# Patient Record
Sex: Female | Born: 1990
Health system: Southern US, Community
[De-identification: ages and names within clinical notes are randomized; demographics above are authoritative.]

## PROBLEM LIST (undated history)

## (undated) DIAGNOSIS — D649 Anemia, unspecified: Secondary | ICD-10-CM

## (undated) DIAGNOSIS — T7840XA Allergy, unspecified, initial encounter: Secondary | ICD-10-CM

## (undated) HISTORY — DX: Allergy, unspecified, initial encounter: T78.40XA

## (undated) HISTORY — DX: Anemia, unspecified: D64.9

## (undated) HISTORY — PX: NO PAST SURGERIES: SHX2092

---
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
## 2009-07-21 ENCOUNTER — Ambulatory Visit: Payer: Self-pay | Admitting: Family Medicine

## 2009-08-30 ENCOUNTER — Ambulatory Visit: Payer: Self-pay | Admitting: Family Medicine

## 2009-08-30 DIAGNOSIS — R300 Dysuria: Secondary | ICD-10-CM | POA: Insufficient documentation

## 2009-08-30 LAB — CONVERTED CEMR LAB
Glucose, Urine, Semiquant: NEGATIVE
Nitrite: NEGATIVE
Specific Gravity, Urine: >=1.03
pH: 5

## 2010-04-05 NOTE — Assessment & Plan Note (Signed)
Summary: talk about birth control.   Vital Signs:  Patient profile:   20 year old female Height:      65 inches Weight:      143 pounds BMI:     23.88 Temp:     98.1 degrees F oral Pulse rate:   72 / minute Pulse rhythm:   regular BP sitting:   100 / 60  (left arm) Cuff size:   regular  Vitals Entered By: Linde Gillis CMA Duncan Dull) (Jul 21, 2009 12:08 PM) CC: talk about birth control   History of Present Illness: 20 yo female new to me here to discuss birth control.  Sexually active since last year with one partner. They use condoms but she is interested in OCPs to also help regulate her periods. Sometimes skips several weeks between periods. Never heavy, cramping not severe.  Never had a pap smear. No h/o STDs. Never been on birth control in past.  No h/o elevated TG.  Current Medications (verified): 1)  Ortho Tri-Cyclen (28) 0.18/0.215/0.25 Mg-35 Mcg Tabs (Norgestim-Eth Estrad Triphasic) .... Use As Directed.  Allergies (verified): No Known Drug Allergies  Review of Systems      See HPI  Physical Exam  General:  Well-developed,well-nourished,in no acute distress; alert,appropriate and cooperative throughout examination Psych:  Cognition and judgment appear intact. Alert and cooperative with normal attention span and concentration. No apparent delusions, illusions, hallucinations   Impression & Recommendations:  Problem # 1:  CONTRACEPTIVE MANAGEMENT (ICD-V25.09) Assessment New Time spent with patient 25 minutes, more than 50% of this time was spent counseling patient on contraception.  Will start ortho tricylin.  I did advise making appt with primary for complete physicial for pap smear and lipid panel within the year.  Complete Medication List: 1)  Ortho Tri-cyclen (28) 0.18/0.215/0.25 Mg-35 Mcg Tabs (Norgestim-eth estrad triphasic) .... Use as directed. Prescriptions: ORTHO TRI-CYCLEN (28) 0.18/0.215/0.25 MG-35 MCG TABS (NORGESTIM-ETH ESTRAD TRIPHASIC) Use as  directed.  #1 x 11   Entered and Authorized by:   Ruthe Mannan MD   Signed by:   Ruthe Mannan MD on 07/21/2009   Method used:   [facility]        [facility] (retail)       [address]       [place], [place]  [address]       Ph: [phone] or [phone]       Fax: [phone]   RxID:   [id number]   Prior Medications (reviewed today): None Current Allergies (reviewed today): No known allergies

## 2010-04-05 NOTE — Assessment & Plan Note (Signed)
Summary: UTI/DLO  Nurse Visit   Allergies: No Known Drug Allergies Laboratory Results   Urine Tests  Date/Time Received: August 30, 2009 3:55 PM  Date/Time Reported: August 30, 2009 3:55 PM   Routine Urinalysis   Color: yellow Appearance: Clear Glucose: negative   (Normal Range: Negative) Bilirubin: negative   (Normal Range: Negative) Ketone: negative   (Normal Range: Negative) Spec. Gravity: >=1.030   (Normal Range: 1.003-1.035) Blood: negative   (Normal Range: Negative) pH: 5.0   (Normal Range: 5.0-8.0) Protein: trace   (Normal Range: Negative) Urobilinogen: 0.2   (Normal Range: 0-1) Nitrite: negative   (Normal Range: Negative) Leukocyte Esterace: negative   (Normal Range: Negative)    Comments: Pt complains of urinary frequency, pressure after voiding.  [facility].    Appended Document: UTI/DLO please let pt know it is normal. Will send for culture.  In the mean time, try Azo for symptoms.  Appended Document: UTI/DLO    Clinical Lists Changes  Problems: Added new problem of DYSURIA (ICD-788.1) Orders: Added new Service order of Specimen Handling ([id number]) - Signed Added new Test order of T-Urine Culture (Spectrum Order) [id number]) - Signed

## 2011-08-07 ENCOUNTER — Other Ambulatory Visit (HOSPITAL_COMMUNITY)
Admission: RE | Admit: 2011-08-07 | Discharge: 2011-08-07 | Disposition: A | Discharge status: Home / Self Care | Payer: BC Managed Care – PPO | Source: Ambulatory Visit | Attending: Family Medicine | Admitting: Family Medicine

## 2011-08-07 ENCOUNTER — Ambulatory Visit (INDEPENDENT_AMBULATORY_CARE_PROVIDER_SITE_OTHER): Payer: BC Managed Care – PPO | Admitting: Family Medicine

## 2011-08-07 ENCOUNTER — Encounter: Payer: Self-pay | Admitting: Family Medicine

## 2011-08-07 VITALS — BP 108/70 | HR 68 | Temp 98.1°F | Wt 173.0 lb

## 2011-08-07 DIAGNOSIS — Z01419 Encounter for gynecological examination (general) (routine) without abnormal findings: Secondary | ICD-10-CM | POA: Insufficient documentation

## 2011-08-07 DIAGNOSIS — Z0000 Encounter for general adult medical examination without abnormal findings: Secondary | ICD-10-CM | POA: Insufficient documentation

## 2011-08-07 DIAGNOSIS — Z136 Encounter for screening for cardiovascular disorders: Secondary | ICD-10-CM

## 2011-08-07 DIAGNOSIS — Z113 Encounter for screening for infections with a predominantly sexual mode of transmission: Secondary | ICD-10-CM | POA: Insufficient documentation

## 2011-08-07 LAB — COMPREHENSIVE METABOLIC PANEL
Albumin: 3.8 g/dL (ref 3.5–5.2)
Alkaline Phosphatase: 49 U/L (ref 39–117)
BUN: 14 mg/dL (ref 6–23)
CO2: 27 mEq/L (ref 19–32)
GFR: 144.42 mL/min (ref >60.00)
Glucose, Bld: 85 mg/dL (ref 70–99)
Sodium: 140 mEq/L (ref 135–145)
Total Bilirubin: 0.6 mg/dL (ref 0.3–1.2)
Total Protein: 6.9 g/dL (ref 6.0–8.3)

## 2011-08-07 LAB — LIPID PANEL
Cholesterol: 176 mg/dL (ref 0–200)
Triglycerides: 49 mg/dL (ref 0.0–149.0)

## 2011-08-07 MED ORDER — NORGESTIM-ETH ESTRAD TRIPHASIC 0.18/0.215/0.25 MG-35 MCG PO TABS: 1.0000 | ORAL_TABLET | Freq: Every day | ORAL | Status: DC

## 2011-08-07 NOTE — Patient Instructions (Signed)
Great to see you. Please say hello to your family for me. Have a great summer. We will call you with your results from today.

## 2011-08-07 NOTE — Progress Notes (Signed)
Subjective:    Patient ID: Regina Kidd, female    DOB: 11-05-1990, 21 y.o.   MRN: [id number]  HPI  21 yo G0 here for CPX/Gyn exam.  Currently sexually active with one partner, uses condoms. She would like to go back on OCPs, periods are very irregular.  Denies any vaginal discharge, dysuria or other symptoms.  Has never had a pap smear.  No family h/o cervical CA or ovarian CA.  Patient Active Problem List  Diagnoses  . DYSURIA  . Routine general medical examination at a health care facility  . Routine gynecological examination   No past medical history on file. No past surgical history on file. History  Substance Use Topics  . Smoking status: Never Smoker   . Smokeless tobacco: Not on file  . Alcohol Use: Not on file   No family history on file. No Known Allergies Current Outpatient Prescriptions on File Prior to Visit  Medication Sig Dispense Refill  . Norgestimate-Ethinyl Estradiol Triphasic 0.18/0.215/0.25 MG-35 MCG tablet Take 1 tablet by mouth daily.  1 Package  11   The PMH, PSH, Social History, Family History, Medications, and allergies have been reviewed in [facility], and have been updated if relevant.    Review of Systems See HPI Patient reports no  vision/ hearing changes,anorexia, weight change, fever ,adenopathy, persistant / recurrent hoarseness, swallowing issues, chest pain, edema,persistant / recurrent cough, hemoptysis, dyspnea(rest, exertional, paroxysmal nocturnal), gastrointestinal  bleeding (melena, rectal bleeding), abdominal pain, excessive heart burn, GU symptoms(dysuria, hematuria, pyuria, voiding/incontinence  Issues) syncope, focal weakness, severe memory loss, concerning skin lesions, depression, anxiety, abnormal bruising/bleeding, major joint swelling, breast masses or abnormal vaginal bleeding.       Objective:   Physical Exam BP 108/70  Pulse 68  Temp(Src) 98.1 F (36.7 C) (Oral)  Wt 173 lb (78.472 kg)  LMP 07/17/2011  General:   Well-developed,well-nourished,in no acute distress; alert,appropriate and cooperative throughout examination Head:  normocephalic and atraumatic.   Eyes:  vision grossly intact, pupils equal, pupils round, and pupils reactive to light.   Ears:  R ear normal and L ear normal.   Nose:  no external deformity.   Mouth:  good dentition.   Neck:  No deformities, masses, or tenderness noted. Breasts:  No mass, nodules, thickening, tenderness, bulging, retraction, inflamation, nipple discharge or skin changes noted.   Lungs:  Normal respiratory effort, chest expands symmetrically. Lungs are clear to auscultation, no crackles or wheezes. Heart:  Normal rate and regular rhythm. S1 and S2 normal without gallop, murmur, click, rub or other extra sounds. Abdomen:  Bowel sounds positive,abdomen soft and non-tender without masses, organomegaly or hernias noted. Rectal:  no external abnormalities.   Genitalia:  Pelvic Exam:        External: normal female genitalia without lesions or masses        Vagina: normal without lesions or masses        Cervix: normal without lesions or masses        Adnexa: normal bimanual exam without masses or fullness        Uterus: normal by palpation        Pap smear: performed Msk:  No deformity or scoliosis noted of thoracic or lumbar spine.   Extremities:  No clubbing, cyanosis, edema, or deformity noted with normal full range of motion of all joints.   Neurologic:  alert & oriented X3 and gait normal.   Skin:  Intact without suspicious lesions or rashes Cervical Nodes:  No lymphadenopathy noted  Axillary Nodes:  No palpable lymphadenopathy Psych:  Cognition and judgment appear intact. Alert and cooperative with normal attention span and concentration. No apparent delusions, illusions, hallucinations     Assessment & Plan:   1. Routine general medical examination at a health care facility  Reviewed preventive care protocols, scheduled due services, and updated  immunizations Discussed nutrition, exercise, diet, and healthy lifestyle.  Comprehensive metabolic panel, Cytology - PAP  2. Routine gynecological examination  Rx for orthotricyclin (which she was taking previously) sent to pharmacy. Discussed importance of continuing to use condoms for STD protection. Refused testing today for HIV and RPR.  She will call back to set up lab appt. Cytology - PAP GC/Chlamydia  3. Screening for ischemic heart disease  Lipid Panel

## 2011-08-14 ENCOUNTER — Ambulatory Visit: Payer: BC Managed Care – PPO | Admitting: Family Medicine

## 2011-08-17 ENCOUNTER — Other Ambulatory Visit (HOSPITAL_COMMUNITY)
Admission: RE | Admit: 2011-08-17 | Discharge: 2011-08-17 | Disposition: A | Discharge status: Home / Self Care | Payer: BC Managed Care – PPO | Source: Ambulatory Visit | Attending: Family Medicine | Admitting: Family Medicine

## 2011-08-17 ENCOUNTER — Ambulatory Visit (INDEPENDENT_AMBULATORY_CARE_PROVIDER_SITE_OTHER): Payer: BC Managed Care – PPO | Admitting: Family Medicine

## 2011-08-17 DIAGNOSIS — Z01419 Encounter for gynecological examination (general) (routine) without abnormal findings: Secondary | ICD-10-CM | POA: Insufficient documentation

## 2011-08-17 DIAGNOSIS — Z124 Encounter for screening for malignant neoplasm of cervix: Secondary | ICD-10-CM

## 2011-08-17 DIAGNOSIS — R87810 Cervical high risk human papillomavirus (HPV) DNA test positive: Secondary | ICD-10-CM | POA: Insufficient documentation

## 2011-08-17 DIAGNOSIS — R87615 Unsatisfactory cytologic smear of cervix: Secondary | ICD-10-CM

## 2011-08-17 NOTE — Addendum Note (Signed)
Addended by: Eliezer Bottom on: 08/17/2011 09:45 AM   Modules accepted: Orders

## 2011-08-17 NOTE — Progress Notes (Signed)
Very pleasant 52 you female here for repeat pap- free of charge.  Pap last week- specimen was too scant to be read.   She is having no complaints.   General:  Well-developed,well-nourished,in no acute distress; alert,appropriate and cooperative throughout examination Rectal:  no external abnormalities.   Genitalia:  Pelvic Exam:        External: normal female genitalia without lesions or masses        Vagina: normal without lesions or masses        Cervix: normal without lesions or masses        Adnexa: normal bimanual exam without masses or fullness        Uterus: normal by palpation        Pap smear: performed  Assessment and Plan: 1. Encounter for repeat Pap smear due to previous insufficient cervical cells    Pap repeated today. Tolerated well.

## 2012-06-20 ENCOUNTER — Other Ambulatory Visit: Payer: Self-pay

## 2012-06-20 NOTE — Telephone Encounter (Signed)
Pt request refill on BC pill; pt had rx transferred to [facility] and they said she does not have refills. Pt coming home and wants refill to [facility]. Pt has appt to see Dr Dayton Martes in 07/2012. [place] said pt should still have 3 refills. Pt will pick up rx.

## 2012-07-11 ENCOUNTER — Ambulatory Visit: Payer: BC Managed Care – PPO | Admitting: Family Medicine

## 2012-07-15 ENCOUNTER — Ambulatory Visit (INDEPENDENT_AMBULATORY_CARE_PROVIDER_SITE_OTHER): Payer: BC Managed Care – PPO | Admitting: Family Medicine

## 2012-07-15 ENCOUNTER — Other Ambulatory Visit (HOSPITAL_COMMUNITY)
Admission: RE | Admit: 2012-07-15 | Discharge: 2012-07-15 | Disposition: A | Discharge status: Home / Self Care | Payer: BC Managed Care – PPO | Source: Ambulatory Visit | Attending: Family Medicine | Admitting: Family Medicine

## 2012-07-15 ENCOUNTER — Encounter: Payer: Self-pay | Admitting: Family Medicine

## 2012-07-15 VITALS — BP 100/60 | HR 72 | Temp 98.2°F | Wt 176.0 lb

## 2012-07-15 DIAGNOSIS — Z3009 Encounter for other general counseling and advice on contraception: Secondary | ICD-10-CM | POA: Insufficient documentation

## 2012-07-15 DIAGNOSIS — Z113 Encounter for screening for infections with a predominantly sexual mode of transmission: Secondary | ICD-10-CM

## 2012-07-15 DIAGNOSIS — Z01419 Encounter for gynecological examination (general) (routine) without abnormal findings: Secondary | ICD-10-CM

## 2012-07-15 MED ORDER — NORGESTIM-ETH ESTRAD TRIPHASIC 0.18/0.215/0.25 MG-35 MCG PO TABS: 1.0000 | ORAL_TABLET | Freq: Every day | ORAL | Status: DC

## 2012-07-15 NOTE — Progress Notes (Signed)
Subjective:    Patient ID: Regina Kidd, female    DOB: 1990-05-27, 22 y.o.   MRN: [id number]  HPI  22 yo G0 here for refill of OCPs/pap smear.  Currently sexually active with one partner, uses condoms. Periods are now regular on OCPs.   No spotting during the month.  Denies any vaginal discharge, dysuria or other symptoms.  Last pap smear(1st one) done here in 08/2011.  LSIL with positive HPV- needed repeat cytology(pap) in 1 year but [place] in [place]- would like pap done today.   No family h/o cervical CA or ovarian CA.  Patient Active Problem List   Diagnosis Date Noted  . Other general counseling and advice for contraceptive management 07/15/2012   No past medical history on file. No past surgical history on file. History  Substance Use Topics  . Smoking status: Never Smoker   . Smokeless tobacco: Not on file  . Alcohol Use: Not on file   No family history on file. No Known Allergies Current Outpatient Prescriptions on File Prior to Visit  Medication Sig Dispense Refill  . Norgestimate-Ethinyl Estradiol Triphasic 0.18/0.215/0.25 MG-35 MCG tablet Take 1 tablet by mouth daily.  1 Package  11   No current facility-administered medications on file prior to visit.   The PMH, PSH, Social History, Family History, Medications, and allergies have been reviewed in [facility], and have been updated if relevant.    Review of Systems See HPI     Objective:   Physical Exam BP 100/60  Pulse 72  Temp(Src) 98.2 F (36.8 C)  Wt 176 lb (79.833 kg)  BMI 29.29 kg/m2   General:  Well-developed,well-nourished,in no acute distress; alert,appropriate and cooperative throughout examination Head:  normocephalic and atraumatic.   Eyes:  vision grossly intact, pupils equal, pupils round, and pupils reactive to light.   Ears:  R ear normal and L ear normal.   Nose:  no external deformity.   Mouth:  good dentition.   Neck:  No deformities, masses, or tenderness  noted. Breasts:  No mass, nodules, thickening, tenderness, bulging, retraction, inflamation, nipple discharge or skin changes noted.   Lungs:  Normal respiratory effort, chest expands symmetrically. Lungs are clear to auscultation, no crackles or wheezes. Heart:  Normal rate and regular rhythm. S1 and S2 normal without gallop, murmur, click, rub or other extra sounds. Abdomen:  Bowel sounds positive,abdomen soft and non-tender without masses, organomegaly or hernias noted. Rectal:  no external abnormalities.   Genitalia:  Pelvic Exam:        External: normal female genitalia without lesions or masses        Vagina: normal without lesions or masses        Cervix: normal without lesions or masses        Adnexa: normal bimanual exam without masses or fullness        Uterus: normal by palpation        Pap smear: performed Msk:  No deformity or scoliosis noted of thoracic or lumbar spine.   Extremities:  No clubbing, cyanosis, edema, or deformity noted with normal full range of motion of all joints.   Neurologic:  alert & oriented X3 and gait normal.   Skin:  Intact without suspicious lesions or rashes Cervical Nodes:  No lymphadenopathy noted Axillary Nodes:  No palpable lymphadenopathy Psych:  Cognition and judgment appear intact. Alert and cooperative with normal attention span and concentration. No apparent delusions, illusions, hallucinations  Assessment & Plan:    1. Other general counseling and advice for contraceptive management Doing well on current OCPs.  Rx refilled.  2. Encounter for routine gynecological examination Discussed dangers of smoking, alcohol, and drug abuse.  Also discussed sexual activity, pregnancy risk, and STD risk.  Encouraged to get regular exercise. Pap smear, GC/Chlamydia.

## 2012-11-21 ENCOUNTER — Ambulatory Visit (INDEPENDENT_AMBULATORY_CARE_PROVIDER_SITE_OTHER): Payer: BC Managed Care – PPO | Admitting: Family Medicine

## 2012-11-21 ENCOUNTER — Ambulatory Visit: Payer: Self-pay | Admitting: Family Medicine

## 2012-11-21 ENCOUNTER — Encounter: Payer: Self-pay | Admitting: Family Medicine

## 2012-11-21 VITALS — BP 102/70 | HR 77 | Temp 98.3°F | Ht 65.0 in | Wt 187.0 lb

## 2012-11-21 DIAGNOSIS — J069 Acute upper respiratory infection, unspecified: Secondary | ICD-10-CM

## 2012-11-21 MED ORDER — HYDROCODONE-HOMATROPINE 5-1.5 MG/5ML PO SYRP: 5.0000 mL | ORAL_SOLUTION | Freq: Three times a day (TID) | ORAL | Status: DC | PRN

## 2012-11-21 MED ORDER — AMOXICILLIN 875 MG PO TABS: 875.0000 mg | ORAL_TABLET | Freq: Two times a day (BID) | ORAL | Status: DC

## 2012-11-21 NOTE — Patient Instructions (Addendum)
Take amoxicillin as directed.  Drink lots of fluids.   Treat sympotmatically with Mucinex, nasal saline irrigation, and Tylenol/Ibuprofen.   You can use warm compresses.  Cough suppressant at night.   Call if not improving as expected in 5-7 days.

## 2012-11-21 NOTE — Progress Notes (Signed)
SUBJECTIVE:  Regina Kidd is a 22 y.o. female who complains of coryza, congestion, sneezing, sore throat, dry cough and bilateral sinus pain for 12 days. She denies a history of anorexia, chest pain, fevers, myalgias, shortness of breath, sweats and vomiting and denies a history of asthma. Patient denies smoke cigarettes.   There are no active problems to display for this patient.  No past medical history on file. No past surgical history on file. History  Substance Use Topics  . Smoking status: Never Smoker   . Smokeless tobacco: Not on file  . Alcohol Use: Not on file   No family history on file. No Known Allergies Current Outpatient Prescriptions on File Prior to Visit  Medication Sig Dispense Refill  . Norgestimate-Ethinyl Estradiol Triphasic 0.18/0.215/0.25 MG-35 MCG tablet Take 1 tablet by mouth daily.  1 Package  11   No current facility-administered medications on file prior to visit.   The PMH, PSH, Social History, Family History, Medications, and allergies have been reviewed in [facility], and have been updated if relevant.  OBJECTIVE: BP 102/70  Pulse 77  Temp(Src) 98.3 F (36.8 C)  Ht 5\' 5"  (1.651 m)  Wt 187 lb (84.823 kg)  BMI 31.12 kg/m2  She appears well, vital signs are as noted. Ears normal.  Throat and pharynx normal.  Neck supple. No adenopathy in the neck. Nose is congested. Sinuses tender. The chest is clear, without wheezes or rales.  ASSESSMENT:  sinusitis  PLAN: Given duration and progression of symptoms, will treat for bacterial sinusitis with amoxicillin. Tussionex as needed at bed time for cough. Discussed using condoms while and short period after taking abx as abx can decrease effectiveness of OCPs. Symptomatic therapy suggested: push fluids, rest and return office visit prn if symptoms persist or worsen. Call or return to clinic prn if these symptoms worsen or fail to improve as anticipated. The patient indicates understanding of these issues and  agrees with the plan.

## 2013-06-22 ENCOUNTER — Other Ambulatory Visit: Payer: Self-pay | Admitting: Family Medicine

## 2013-07-22 ENCOUNTER — Other Ambulatory Visit: Payer: Self-pay | Admitting: Family Medicine

## 2013-07-25 ENCOUNTER — Other Ambulatory Visit: Payer: Self-pay | Admitting: Family Medicine

## 2013-07-25 NOTE — Telephone Encounter (Signed)
Attempted to contact pt; unable to leave message. Rx sent to requested pharmacy. Pt MUST be seen with CPE to receive additional refills. LAst CPE 10/2011

## 2013-10-09 ENCOUNTER — Other Ambulatory Visit: Payer: Self-pay | Admitting: Family Medicine

## 2013-10-16 ENCOUNTER — Encounter: Payer: BC Managed Care – PPO | Admitting: Family Medicine

## 2013-10-23 ENCOUNTER — Other Ambulatory Visit (HOSPITAL_COMMUNITY)
Admission: RE | Admit: 2013-10-23 | Discharge: 2013-10-23 | Disposition: A | Discharge status: Home / Self Care | Payer: BC Managed Care – PPO | Source: Ambulatory Visit | Attending: Family Medicine | Admitting: Family Medicine

## 2013-10-23 ENCOUNTER — Ambulatory Visit (INDEPENDENT_AMBULATORY_CARE_PROVIDER_SITE_OTHER): Payer: BC Managed Care – PPO | Admitting: Family Medicine

## 2013-10-23 ENCOUNTER — Encounter: Payer: Self-pay | Admitting: Family Medicine

## 2013-10-23 ENCOUNTER — Encounter (INDEPENDENT_AMBULATORY_CARE_PROVIDER_SITE_OTHER): Payer: Self-pay

## 2013-10-23 VITALS — BP 102/72 | HR 69 | Temp 98.5°F | Ht 64.5 in | Wt 188.0 lb

## 2013-10-23 DIAGNOSIS — N760 Acute vaginitis: Secondary | ICD-10-CM | POA: Diagnosis present

## 2013-10-23 DIAGNOSIS — Z01419 Encounter for gynecological examination (general) (routine) without abnormal findings: Secondary | ICD-10-CM | POA: Insufficient documentation

## 2013-10-23 DIAGNOSIS — Z0000 Encounter for general adult medical examination without abnormal findings: Secondary | ICD-10-CM | POA: Insufficient documentation

## 2013-10-23 DIAGNOSIS — Z113 Encounter for screening for infections with a predominantly sexual mode of transmission: Secondary | ICD-10-CM | POA: Insufficient documentation

## 2013-10-23 DIAGNOSIS — Z136 Encounter for screening for cardiovascular disorders: Secondary | ICD-10-CM

## 2013-10-23 DIAGNOSIS — Z1151 Encounter for screening for human papillomavirus (HPV): Secondary | ICD-10-CM | POA: Diagnosis present

## 2013-10-23 LAB — COMPREHENSIVE METABOLIC PANEL
ALT: 14 U/L (ref 0–35)
AST: 21 U/L (ref 0–37)
Albumin: 3.6 g/dL (ref 3.5–5.2)
Alkaline Phosphatase: 60 U/L (ref 39–117)
BUN: 9 mg/dL (ref 6–23)
CALCIUM: 9.1 mg/dL (ref 8.4–10.5)
CHLORIDE: 105 meq/L (ref 96–112)
CO2: 29 meq/L (ref 19–32)
Creatinine, Ser: 0.8 mg/dL (ref 0.4–1.2)
GFR: 91.18 mL/min (ref >60.00)
Glucose, Bld: 76 mg/dL (ref 70–99)
Potassium: 4.2 mEq/L (ref 3.5–5.1)
Sodium: 138 mEq/L (ref 135–145)
Total Bilirubin: 0.6 mg/dL (ref 0.2–1.2)
Total Protein: 6.6 g/dL (ref 6.0–8.3)

## 2013-10-23 LAB — CBC WITH DIFFERENTIAL/PLATELET
Basophils Absolute: 0 10*3/uL (ref 0.0–0.1)
Basophils Relative: 0.5 % (ref 0.0–3.0)
Eosinophils Absolute: 0 10*3/uL (ref 0.0–0.7)
Eosinophils Relative: 0.4 % (ref 0.0–5.0)
HCT: 40.1 % (ref 36.0–46.0)
HEMOGLOBIN: 13.1 g/dL (ref 12.0–15.0)
LYMPHS PCT: 24.3 % (ref 12.0–46.0)
Lymphs Abs: 2 10*3/uL (ref 0.7–4.0)
MCHC: 32.6 g/dL (ref 30.0–36.0)
MCV: 87.9 fl (ref 78.0–100.0)
MONOS PCT: 6.4 % (ref 3.0–12.0)
Monocytes Absolute: 0.5 10*3/uL (ref 0.1–1.0)
NEUTROS ABS: 5.5 10*3/uL (ref 1.4–7.7)
Neutrophils Relative %: 68.4 % (ref 43.0–77.0)
Platelets: 207 10*3/uL (ref 150.0–400.0)
RBC: 4.56 Mil/uL (ref 3.87–5.11)
RDW: 12.5 % (ref 11.5–15.5)
WBC: 8.1 10*3/uL (ref 4.0–10.5)

## 2013-10-23 LAB — LIPID PANEL
Cholesterol: 186 mg/dL (ref 0–200)
HDL: 53.9 mg/dL (ref >39.00)
LDL Cholesterol: 114 mg/dL — ABNORMAL HIGH (ref 0–99)
NonHDL: 132.1
TRIGLYCERIDES: 91 mg/dL (ref 0.0–149.0)
Total CHOL/HDL Ratio: 3
VLDL: 18.2 mg/dL (ref 0.0–40.0)

## 2013-10-23 LAB — TSH: TSH: 3.02 u[IU]/mL (ref 0.35–4.50)

## 2013-10-23 NOTE — Progress Notes (Signed)
Pre visit review using our clinic review tool, if applicable. No additional management support is needed unless otherwise documented below in the visit note. 

## 2013-10-23 NOTE — Addendum Note (Signed)
Addended by: Desmond DikeKNIGHT, Roshawn Lacina H on: 10/23/2013 12:20 PM   Modules accepted: Orders

## 2013-10-23 NOTE — Assessment & Plan Note (Signed)
Reviewed preventive care protocols, scheduled due services, and updated immunizations Discussed nutrition, exercise, diet, and healthy lifestyle.  Orders Placed This Encounter  Procedures  . CBC with Differential  . Comprehensive metabolic panel  . Lipid panel  . TSH  . HIV antibody (with reflex)  . RPR    

## 2013-10-23 NOTE — Patient Instructions (Signed)
Good to see you. We will call you with your lab results.   

## 2013-10-23 NOTE — Progress Notes (Signed)
Subjective:    Patient ID: Regina Kidd, female    DOB: 1990/06/03, 23 y.o.   MRN: [id number]  HPI  23 yo G0 here for CPX. Doing well- just got engaged! Currently sexually active with one partner, uses condoms and OCPs. Periods are now regular on OCPs.   No spotting during the month.  Denies any vaginal discharge, dysuria or other symptoms.  Last pap smear was done by me on 07/15/2012- neg.  Did have pap that was positive for LSIL with positive HPV on 08/17/2011.  No family h/o cervical CA or ovarian CA.  Lab Results  Component Value Date   CHOL 176 08/07/2011   HDL 56.60 08/07/2011   LDLCALC 110* 08/07/2011   TRIG 49.0 08/07/2011   CHOLHDL 3 08/07/2011   Lab Results  Component Value Date   CREATININE 0.6 08/07/2011   No results found for this basename: WBC, HGB, HCT, MCV, PLT    Patient Active Problem List   Diagnosis Date Noted  . Routine general medical examination at a health care facility 10/23/2013   No past medical history on file. No past surgical history on file. History  Substance Use Topics  . Smoking status: Never Smoker   . Smokeless tobacco: Not on file  . Alcohol Use: Not on file   No family history on file. No Known Allergies Current Outpatient Prescriptions on File Prior to Visit  Medication Sig Dispense Refill  . TRI-PREVIFEM 0.18/0.215/0.25 MG-35 MCG tablet TAKE 1 TABLET BY MOUTH DAILY.  28 tablet  2   No current facility-administered medications on file prior to visit.   The PMH, PSH, Social History, Family History, Medications, and allergies have been reviewed in [facility], and have been updated if relevant.    Review of Systems See HPI Patient reports no  vision/ hearing changes,anorexia, weight change, fever ,adenopathy, persistant / recurrent hoarseness, swallowing issues, chest pain, edema,persistant / recurrent cough, hemoptysis, dyspnea(rest, exertional, paroxysmal nocturnal), gastrointestinal  bleeding (melena, rectal bleeding), abdominal  pain, excessive heart burn, GU symptoms(dysuria, hematuria, pyuria, voiding/incontinence  Issues) syncope, focal weakness, severe memory loss, concerning skin lesions, depression, anxiety, abnormal bruising/bleeding, major joint swelling, breast masses or abnormal vaginal bleeding.       Objective:   Physical Exam BP 102/72  Pulse 69  Temp(Src) 98.5 F (36.9 C) (Oral)  Ht 5' 4.5" (1.638 m)  Wt 188 lb (85.276 kg)  BMI 31.78 kg/m2  SpO2 99%  LMP 10/14/2013   General:  Well-developed,well-nourished,in no acute distress; alert,appropriate and cooperative throughout examination Head:  normocephalic and atraumatic.   Eyes:  vision grossly intact, pupils equal, pupils round, and pupils reactive to light.   Ears:  R ear normal and L ear normal.   Nose:  no external deformity.   Mouth:  good dentition.   Neck:  No deformities, masses, or tenderness noted. Breasts:  No mass, nodules, thickening, tenderness, bulging, retraction, inflamation, nipple discharge or skin changes noted.   Lungs:  Normal respiratory effort, chest expands symmetrically. Lungs are clear to auscultation, no crackles or wheezes. Heart:  Normal rate and regular rhythm. S1 and S2 normal without gallop, murmur, click, rub or other extra sounds. Abdomen:  Bowel sounds positive,abdomen soft and non-tender without masses, organomegaly or hernias noted. Rectal:  no external abnormalities.   Genitalia:  Pelvic Exam:        External: normal female genitalia without lesions or masses        Vagina: normal without lesions or masses  Cervix: normal without lesions or masses        Adnexa: normal bimanual exam without masses or fullness        Uterus: normal by palpation        Pap smear: performed Msk:  No deformity or scoliosis noted of thoracic or lumbar spine.   Extremities:  No clubbing, cyanosis, edema, or deformity noted with normal full range of motion of all joints.   Neurologic:  alert & oriented X3 and gait  normal.   Skin:  Intact without suspicious lesions or rashes Cervical Nodes:  No lymphadenopathy noted Axillary Nodes:  No palpable lymphadenopathy Psych:  Cognition and judgment appear intact. Alert and cooperative with normal attention span and concentration. No apparent delusions, illusions, hallucinations      Assessment & Plan:

## 2013-10-23 NOTE — Assessment & Plan Note (Signed)
Given h/o abnormal pap smear, will repeat pap today. The patient indicates understanding of these issues and agrees with the plan.

## 2013-10-24 ENCOUNTER — Encounter: Payer: Self-pay | Admitting: *Deleted

## 2013-10-24 LAB — HIV ANTIBODY (ROUTINE TESTING W REFLEX): HIV 1&2 Ab, 4th Generation: NONREACTIVE

## 2013-10-24 LAB — RPR

## 2013-10-27 ENCOUNTER — Encounter: Payer: Self-pay | Admitting: *Deleted

## 2013-10-27 LAB — CYTOLOGY - PAP

## 2014-01-06 ENCOUNTER — Other Ambulatory Visit: Payer: Self-pay | Admitting: *Deleted

## 2014-01-06 MED ORDER — NORGESTIM-ETH ESTRAD TRIPHASIC 0.18/0.215/0.25 MG-35 MCG PO TABS: ORAL_TABLET | ORAL | Status: DC

## 2014-10-27 ENCOUNTER — Encounter: Payer: Self-pay | Admitting: Family Medicine

## 2014-10-27 ENCOUNTER — Ambulatory Visit (INDEPENDENT_AMBULATORY_CARE_PROVIDER_SITE_OTHER): Payer: BLUE CROSS/BLUE SHIELD | Admitting: Family Medicine

## 2014-10-27 ENCOUNTER — Other Ambulatory Visit (HOSPITAL_COMMUNITY)
Admission: RE | Admit: 2014-10-27 | Discharge: 2014-10-27 | Disposition: A | Discharge status: Home / Self Care | Payer: BLUE CROSS/BLUE SHIELD | Source: Ambulatory Visit | Attending: Family Medicine | Admitting: Family Medicine

## 2014-10-27 VITALS — BP 102/72 | HR 85 | Temp 98.2°F | Ht 65.0 in | Wt 209.2 lb

## 2014-10-27 DIAGNOSIS — Z01419 Encounter for gynecological examination (general) (routine) without abnormal findings: Secondary | ICD-10-CM

## 2014-10-27 DIAGNOSIS — N760 Acute vaginitis: Secondary | ICD-10-CM | POA: Insufficient documentation

## 2014-10-27 DIAGNOSIS — Z113 Encounter for screening for infections with a predominantly sexual mode of transmission: Secondary | ICD-10-CM | POA: Insufficient documentation

## 2014-10-27 DIAGNOSIS — Z1151 Encounter for screening for human papillomavirus (HPV): Secondary | ICD-10-CM | POA: Insufficient documentation

## 2014-10-27 DIAGNOSIS — Z0000 Encounter for general adult medical examination without abnormal findings: Secondary | ICD-10-CM

## 2014-10-27 LAB — LIPID PANEL
CHOL/HDL RATIO: 3
Cholesterol: 217 mg/dL — ABNORMAL HIGH (ref 0–200)
HDL: 62.2 mg/dL (ref >39.00)
LDL CALC: 133 mg/dL — AB (ref 0–99)
NonHDL: 154.72
TRIGLYCERIDES: 107 mg/dL (ref 0.0–149.0)
VLDL: 21.4 mg/dL (ref 0.0–40.0)

## 2014-10-27 LAB — CBC WITH DIFFERENTIAL/PLATELET
BASOS PCT: 0.5 % (ref 0.0–3.0)
Basophils Absolute: 0 10*3/uL (ref 0.0–0.1)
EOS ABS: 0.1 10*3/uL (ref 0.0–0.7)
EOS PCT: 0.8 % (ref 0.0–5.0)
HCT: 39.4 % (ref 36.0–46.0)
Hemoglobin: 13.3 g/dL (ref 12.0–15.0)
LYMPHS ABS: 2 10*3/uL (ref 0.7–4.0)
Lymphocytes Relative: 23.2 % (ref 12.0–46.0)
MCHC: 33.7 g/dL (ref 30.0–36.0)
MCV: 85.5 fl (ref 78.0–100.0)
MONO ABS: 0.6 10*3/uL (ref 0.1–1.0)
Monocytes Relative: 7.2 % (ref 3.0–12.0)
NEUTROS ABS: 5.8 10*3/uL (ref 1.4–7.7)
Neutrophils Relative %: 68.3 % (ref 43.0–77.0)
PLATELETS: 211 10*3/uL (ref 150.0–400.0)
RBC: 4.61 Mil/uL (ref 3.87–5.11)
RDW: 13.4 % (ref 11.5–15.5)
WBC: 8.6 10*3/uL (ref 4.0–10.5)

## 2014-10-27 LAB — COMPREHENSIVE METABOLIC PANEL
ALT: 10 U/L (ref 0–35)
AST: 13 U/L (ref 0–37)
Albumin: 3.6 g/dL (ref 3.5–5.2)
Alkaline Phosphatase: 67 U/L (ref 39–117)
BUN: 13 mg/dL (ref 6–23)
CHLORIDE: 107 meq/L (ref 96–112)
CO2: 25 mEq/L (ref 19–32)
Calcium: 9 mg/dL (ref 8.4–10.5)
Creatinine, Ser: 0.88 mg/dL (ref 0.40–1.20)
GFR: 83.34 mL/min (ref >60.00)
Glucose, Bld: 85 mg/dL (ref 70–99)
POTASSIUM: 3.7 meq/L (ref 3.5–5.1)
SODIUM: 139 meq/L (ref 135–145)
Total Bilirubin: 0.4 mg/dL (ref 0.2–1.2)
Total Protein: 6.5 g/dL (ref 6.0–8.3)

## 2014-10-27 LAB — TSH: TSH: 3.85 u[IU]/mL (ref 0.35–4.50)

## 2014-10-27 NOTE — Assessment & Plan Note (Signed)
Discussed USPSTF recommendations of cervical cancer screening.  She is aware that interval of 2 years is recommended but pt would prefer to have pap smear done today.   

## 2014-10-27 NOTE — Progress Notes (Signed)
Pre visit review using our clinic review tool, if applicable. No additional management support is needed unless otherwise documented below in the visit note. 

## 2014-10-27 NOTE — Assessment & Plan Note (Addendum)
Reviewed preventive care protocols, scheduled due services, and updated immunizations Discussed nutrition, exercise, diet, and healthy lifestyle.  Orders Placed This Encounter  Procedures  . CBC with Differential/Platelet  . Comprehensive metabolic panel  . Lipid panel  . TSH     

## 2014-10-27 NOTE — Addendum Note (Signed)
Addended by: Desmond Dike on: 10/27/2014 09:36 AM   Modules accepted: Orders

## 2014-10-27 NOTE — Addendum Note (Signed)
Addended by: Dianne Dun on: 10/27/2014 09:37 AM   Modules accepted: Orders, SmartSet

## 2014-10-27 NOTE — Patient Instructions (Signed)
It was so great to see you. Your dress is beautiful. I can't wait to see your wedding pictures!  We will call you with your lab results and you can view them online.

## 2014-10-27 NOTE — Progress Notes (Signed)
Subjective:    Patient ID: Regina Kidd, female    DOB: Mar 18, 1990, 24 y.o.   MRN: [id number]  HPI  24 yo G0 here for CPX. Doing well. Getting married this November.  Currently sexually active with one partner, uses condoms and OCPs. Periods are now regular on OCPs.   No spotting during the month.  Denies any vaginal discharge, dysuria or other symptoms.  Last pap smear was done by me on 10/23/13- neg.  Did have pap that was positive for LSIL with positive HPV on 08/17/2011.  No family h/o cervical CA or ovarian CA.  Lab Results  Component Value Date   CHOL 186 10/23/2013   HDL 53.90 10/23/2013   LDLCALC 114* 10/23/2013   TRIG 91.0 10/23/2013   CHOLHDL 3 10/23/2013   Lab Results  Component Value Date   CREATININE 0.8 10/23/2013   Lab Results  Component Value Date   WBC 8.1 10/23/2013    Patient Active Problem List   Diagnosis Date Noted  . Routine general medical examination at a health care facility 10/23/2013  . Encounter for routine gynecological examination 10/23/2013   History reviewed. No pertinent past medical history. History reviewed. No pertinent past surgical history. Social History  Substance Use Topics  . Smoking status: Never Smoker   . Smokeless tobacco: None  . Alcohol Use: None   History reviewed. No pertinent family history. No Known Allergies Current Outpatient Prescriptions on File Prior to Visit  Medication Sig Dispense Refill  . Norgestimate-Ethinyl Estradiol Triphasic (TRI-PREVIFEM) 0.18/0.215/0.25 MG-35 MCG tablet TAKE 1 TABLET BY MOUTH DAILY. 28 tablet 10   No current facility-administered medications on file prior to visit.   The PMH, PSH, Social History, Family History, Medications, and allergies have been reviewed in [facility], and have been updated if relevant.    Review of Systems  Constitutional: Negative.   HENT: Negative.   Eyes: Negative.   Respiratory: Negative.   Cardiovascular: Negative.   Gastrointestinal:  Negative.   Endocrine: Negative.   Genitourinary: Negative.   Musculoskeletal: Negative.   Skin: Negative.   Allergic/Immunologic: Negative.   Neurological: Negative.   Hematological: Negative.   Psychiatric/Behavioral: Negative.   All other systems reviewed and are negative.        Objective:   Physical Exam BP 102/72 mmHg  Pulse 85  Temp(Src) 98.2 F (36.8 C) (Oral)  Ht  (1.651 m)  Wt 209 lb 4 oz (94.915 kg)  BMI 34.82 kg/m2  SpO2 96%  LMP 10/22/2014   General:  Well-developed,well-nourished,in no acute distress; alert,appropriate and cooperative throughout examination Head:  normocephalic and atraumatic.   Eyes:  vision grossly intact, pupils equal, pupils round, and pupils reactive to light.   Ears:  R ear normal and L ear normal.   Nose:  no external deformity.   Mouth:  good dentition.   Neck:  No deformities, masses, or tenderness noted. Breasts:  No mass, nodules, thickening, tenderness, bulging, retraction, inflamation, nipple discharge or skin changes noted.   Lungs:  Normal respiratory effort, chest expands symmetrically. Lungs are clear to auscultation, no crackles or wheezes. Heart:  Normal rate and regular rhythm. S1 and S2 normal without gallop, murmur, click, rub or other extra sounds. Abdomen:  Bowel sounds positive,abdomen soft and non-tender without masses, organomegaly or hernias noted. Rectal:  no external abnormalities.   Genitalia:  Pelvic Exam:        External: normal female genitalia without lesions or masses        Vagina:  normal without lesions or masses        Cervix: normal without lesions or masses        Adnexa: normal bimanual exam without masses or fullness        Uterus: normal by palpation        Pap smear: performed Msk:  No deformity or scoliosis noted of thoracic or lumbar spine.   Extremities:  No clubbing, cyanosis, edema, or deformity noted with normal full range of motion of all joints.   Neurologic:  alert & oriented X3  and gait normal.   Skin:  Intact without suspicious lesions or rashes Cervical Nodes:  No lymphadenopathy noted Axillary Nodes:  No palpable lymphadenopathy Psych:  Cognition and judgment appear intact. Alert and cooperative with normal attention span and concentration. No apparent delusions, illusions, hallucinations      Assessment & Plan:

## 2014-10-28 ENCOUNTER — Encounter: Payer: Self-pay | Admitting: *Deleted

## 2014-10-28 LAB — RPR

## 2014-10-28 LAB — HIV ANTIBODY (ROUTINE TESTING W REFLEX): HIV 1&2 Ab, 4th Generation: NONREACTIVE

## 2014-10-29 LAB — CYTOLOGY - PAP

## 2014-10-30 ENCOUNTER — Encounter: Payer: Self-pay | Admitting: *Deleted

## 2014-10-30 LAB — CERVICOVAGINAL ANCILLARY ONLY
BACTERIAL VAGINITIS: POSITIVE — AB
CANDIDA VAGINITIS: NEGATIVE

## 2014-10-30 MED ORDER — METRONIDAZOLE 500 MG PO TABS: 500.0000 mg | ORAL_TABLET | Freq: Two times a day (BID) | ORAL | Status: DC

## 2014-10-30 NOTE — Addendum Note (Signed)
Addended by: Desmond Dike on: 10/30/2014 04:55 PM   Modules accepted: Orders

## 2014-11-03 LAB — CERVICOVAGINAL ANCILLARY ONLY: Herpes: NEGATIVE

## 2014-11-05 ENCOUNTER — Other Ambulatory Visit: Payer: Self-pay | Admitting: Family Medicine

## 2015-09-14 ENCOUNTER — Other Ambulatory Visit: Payer: Self-pay | Admitting: Family Medicine

## 2015-11-17 ENCOUNTER — Other Ambulatory Visit: Payer: Self-pay | Admitting: Family Medicine

## 2015-11-22 ENCOUNTER — Telehealth: Payer: Self-pay | Admitting: *Deleted

## 2015-11-22 DIAGNOSIS — B379 Candidiasis, unspecified: Secondary | ICD-10-CM

## 2015-11-22 MED ORDER — FLUCONAZOLE 150 MG PO TABS: 150.0000 mg | ORAL_TABLET | Freq: Once | ORAL | 0 refills | Status: DC

## 2015-11-22 NOTE — Telephone Encounter (Signed)
Received telephone note from receptionist that pt was requesting refill on Diflucan, was processing request and sent rx to pharmacy.  Shortly after while documenting and getting ready to call pt, I realized this was not the correct pt.  Called [facility] and stopped rx from being filled.

## 2015-11-22 NOTE — Telephone Encounter (Signed)
-----   Message from Olevia BowensJacinda S Battle sent at 11/22/2015  1:34 PM EDT ----- Regarding: Rx Request Contact: [phone] Wants to know if she could have another diflucan, still feels itchy [facility] [address]

## 2015-12-16 ENCOUNTER — Encounter: Payer: BLUE CROSS/BLUE SHIELD | Admitting: Family Medicine

## 2015-12-17 ENCOUNTER — Other Ambulatory Visit: Payer: Self-pay | Admitting: Family Medicine

## 2015-12-17 NOTE — Telephone Encounter (Signed)
Pt has CPE scheduled for 10/19

## 2015-12-23 ENCOUNTER — Other Ambulatory Visit (HOSPITAL_COMMUNITY)
Admission: RE | Admit: 2015-12-23 | Discharge: 2015-12-23 | Disposition: A | Discharge status: Home / Self Care | Payer: BLUE CROSS/BLUE SHIELD | Source: Ambulatory Visit | Attending: Family Medicine | Admitting: Family Medicine

## 2015-12-23 ENCOUNTER — Ambulatory Visit (INDEPENDENT_AMBULATORY_CARE_PROVIDER_SITE_OTHER): Payer: BLUE CROSS/BLUE SHIELD | Admitting: Family Medicine

## 2015-12-23 ENCOUNTER — Encounter: Payer: Self-pay | Admitting: Family Medicine

## 2015-12-23 VITALS — BP 122/60 | HR 89 | Temp 98.0°F | Ht 65.0 in | Wt 230.0 lb

## 2015-12-23 DIAGNOSIS — Z113 Encounter for screening for infections with a predominantly sexual mode of transmission: Secondary | ICD-10-CM | POA: Insufficient documentation

## 2015-12-23 DIAGNOSIS — Z0000 Encounter for general adult medical examination without abnormal findings: Secondary | ICD-10-CM

## 2015-12-23 DIAGNOSIS — Z01419 Encounter for gynecological examination (general) (routine) without abnormal findings: Secondary | ICD-10-CM | POA: Insufficient documentation

## 2015-12-23 DIAGNOSIS — Z1151 Encounter for screening for human papillomavirus (HPV): Secondary | ICD-10-CM | POA: Insufficient documentation

## 2015-12-23 DIAGNOSIS — N760 Acute vaginitis: Secondary | ICD-10-CM | POA: Diagnosis not present

## 2015-12-23 LAB — LIPID PANEL
CHOLESTEROL: 210 mg/dL — AB (ref 0–200)
HDL: 51 mg/dL (ref >39.00)
LDL Cholesterol: 129 mg/dL — ABNORMAL HIGH (ref 0–99)
NonHDL: 159.46
Total CHOL/HDL Ratio: 4
Triglycerides: 154 mg/dL — ABNORMAL HIGH (ref 0.0–149.0)
VLDL: 30.8 mg/dL (ref 0.0–40.0)

## 2015-12-23 LAB — CBC WITH DIFFERENTIAL/PLATELET
BASOS PCT: 0.8 % (ref 0.0–3.0)
Basophils Absolute: 0.1 10*3/uL (ref 0.0–0.1)
EOS PCT: 0.6 % (ref 0.0–5.0)
Eosinophils Absolute: 0.1 10*3/uL (ref 0.0–0.7)
HCT: 37.9 % (ref 36.0–46.0)
Hemoglobin: 12.9 g/dL (ref 12.0–15.0)
LYMPHS ABS: 2.2 10*3/uL (ref 0.7–4.0)
Lymphocytes Relative: 25.8 % (ref 12.0–46.0)
MCHC: 34 g/dL (ref 30.0–36.0)
MCV: 82.9 fl (ref 78.0–100.0)
MONOS PCT: 6.9 % (ref 3.0–12.0)
Monocytes Absolute: 0.6 10*3/uL (ref 0.1–1.0)
NEUTROS PCT: 65.9 % (ref 43.0–77.0)
Neutro Abs: 5.6 10*3/uL (ref 1.4–7.7)
Platelets: 243 10*3/uL (ref 150.0–400.0)
RBC: 4.57 Mil/uL (ref 3.87–5.11)
RDW: 12.6 % (ref 11.5–15.5)
WBC: 8.6 10*3/uL (ref 4.0–10.5)

## 2015-12-23 LAB — COMPREHENSIVE METABOLIC PANEL
ALBUMIN: 3.8 g/dL (ref 3.5–5.2)
ALK PHOS: 71 U/L (ref 39–117)
ALT: 10 U/L (ref 0–35)
AST: 13 U/L (ref 0–37)
BUN: 13 mg/dL (ref 6–23)
CO2: 27 mEq/L (ref 19–32)
Calcium: 9 mg/dL (ref 8.4–10.5)
Chloride: 105 mEq/L (ref 96–112)
Creatinine, Ser: 0.85 mg/dL (ref 0.40–1.20)
GFR: 85.94 mL/min (ref >60.00)
Glucose, Bld: 88 mg/dL (ref 70–99)
POTASSIUM: 3.9 meq/L (ref 3.5–5.1)
Sodium: 139 mEq/L (ref 135–145)
TOTAL PROTEIN: 6.8 g/dL (ref 6.0–8.3)
Total Bilirubin: 0.6 mg/dL (ref 0.2–1.2)

## 2015-12-23 LAB — TSH: TSH: 2.68 u[IU]/mL (ref 0.35–4.50)

## 2015-12-23 MED ORDER — NORGESTIM-ETH ESTRAD TRIPHASIC 0.18/0.215/0.25 MG-35 MCG PO TABS: 1.0000 | ORAL_TABLET | Freq: Every day | ORAL | 3 refills | Status: DC

## 2015-12-23 NOTE — Progress Notes (Signed)
Pre visit review using our clinic review tool, if applicable. No additional management support is needed unless otherwise documented below in the visit note. 

## 2015-12-23 NOTE — Assessment & Plan Note (Signed)
Reviewed preventive care protocols, scheduled due services, and updated immunizations Discussed nutrition, exercise, diet, and healthy lifestyle.  

## 2015-12-23 NOTE — Addendum Note (Signed)
Addended by: Desmond DikeKNIGHT, Emory Gallentine H on: 12/23/2015 10:12 AM   Modules accepted: Orders

## 2015-12-23 NOTE — Assessment & Plan Note (Signed)
Discussed USPSTF recommendations of cervical cancer screening.  She is aware that interval of 3 years is recommended but pt would prefer to have pap smear done today.  

## 2015-12-23 NOTE — Patient Instructions (Signed)
Great to see you. Happy birthday!   we will call you with your results from today and you can view them online.

## 2015-12-23 NOTE — Progress Notes (Signed)
Subjective:    Patient ID: Regina Kidd, female    DOB: 1990-09-25, 25 y.o.   MRN: [id number]  HPI  25 yo G0 here for CPX. Doing well.   Currently sexually active with one partner, uses condoms and OCPs. Periods are now regular on OCPs.   No spotting during the month.  Denies any vaginal discharge, dysuria or other symptoms.  Last pap smear was done by me on 10/27/14- neg.  Did have pap that was positive for LSIL with positive HPV on 08/17/2011.  No family h/o cervical CA or ovarian CA.  Lab Results  Component Value Date   CHOL 217 (H) 10/27/2014   HDL 62.20 10/27/2014   LDLCALC 133 (H) 10/27/2014   TRIG 107.0 10/27/2014   CHOLHDL 3 10/27/2014   Lab Results  Component Value Date   CREATININE 0.88 10/27/2014   Lab Results  Component Value Date   WBC 8.6 10/27/2014    Patient Active Problem List  Diagnosis  . Routine general medical examination at a health care facility  . Encounter for routine gynecological examination   No past medical history on file. No past surgical history on file. Social History  Substance Use Topics  . Smoking status: Never Smoker  . Smokeless tobacco: Not on file  . Alcohol use Not on file   No family history on file. No Known Allergies No current outpatient prescriptions on file prior to visit.   No current facility-administered medications on file prior to visit.    The PMH, PSH, Social History, Family History, Medications, and allergies have been reviewed in [facility], and have been updated if relevant.    Review of Systems  Constitutional: Negative.   HENT: Negative.   Eyes: Negative.   Respiratory: Negative.   Cardiovascular: Negative.   Gastrointestinal: Negative.   Endocrine: Negative.   Genitourinary: Negative.   Musculoskeletal: Negative.   Skin: Negative.   Allergic/Immunologic: Negative.   Neurological: Negative.   Hematological: Negative.   Psychiatric/Behavioral: Negative.   All other systems reviewed and are  negative.        Objective:   Physical Exam BP 122/60   Pulse 89   Temp 98 F (36.7 C) (Oral)   Ht 5\' 5"  (1.651 m)   Wt 230 lb (104.3 kg)   LMP 12/16/2015   SpO2 97%   BMI 38.27 kg/m    General:  Well-developed,well-nourished,in no acute distress; alert,appropriate and cooperative throughout examination Head:  normocephalic and atraumatic.   Eyes:  vision grossly intact, pupils equal, pupils round, and pupils reactive to light.   Ears:  R ear normal and L ear normal.   Nose:  no external deformity.   Mouth:  good dentition.   Neck:  No deformities, masses, or tenderness noted. Breasts:  No mass, nodules, thickening, tenderness, bulging, retraction, inflamation, nipple discharge or skin changes noted.   Lungs:  Normal respiratory effort, chest expands symmetrically. Lungs are clear to auscultation, no crackles or wheezes. Heart:  Normal rate and regular rhythm. S1 and S2 normal without gallop, murmur, click, rub or other extra sounds. Abdomen:  Bowel sounds positive,abdomen soft and non-tender without masses, organomegaly or hernias noted. Rectal:  no external abnormalities.   Genitalia:  Pelvic Exam:        External: normal female genitalia without lesions or masses        Vagina: normal without lesions or masses        Cervix: normal without lesions or masses  Adnexa: normal bimanual exam without masses or fullness        Uterus: normal by palpation        Pap smear: performed Msk:  No deformity or scoliosis noted of thoracic or lumbar spine.   Extremities:  No clubbing, cyanosis, edema, or deformity noted with normal full range of motion of all joints.   Neurologic:  alert & oriented X3 and gait normal.   Skin:  Intact without suspicious lesions or rashes Cervical Nodes:  No lymphadenopathy noted Axillary Nodes:  No palpable lymphadenopathy Psych:  Cognition and judgment appear intact. Alert and cooperative with normal attention span and concentration. No apparent  delusions, illusions, hallucinations      Assessment & Plan:

## 2015-12-24 ENCOUNTER — Encounter: Payer: Self-pay | Admitting: *Deleted

## 2015-12-24 LAB — CYTOLOGY - PAP
Chlamydia: NEGATIVE
Diagnosis: NEGATIVE
HPV (WINDOPATH): NOT DETECTED
Neisseria Gonorrhea: NEGATIVE
TRICH (WINDOWPATH): NEGATIVE

## 2015-12-24 LAB — RPR

## 2015-12-24 LAB — HIV ANTIBODY (ROUTINE TESTING W REFLEX): HIV: NONREACTIVE

## 2015-12-27 LAB — CERVICOVAGINAL ANCILLARY ONLY
Bacterial vaginitis: NEGATIVE
CANDIDA VAGINITIS: NEGATIVE

## 2015-12-28 ENCOUNTER — Encounter: Payer: Self-pay | Admitting: *Deleted

## 2015-12-28 LAB — CERVICOVAGINAL ANCILLARY ONLY: Herpes: NEGATIVE

## 2017-01-08 ENCOUNTER — Telehealth: Payer: Self-pay

## 2017-01-08 ENCOUNTER — Encounter: Payer: Self-pay | Admitting: Family Medicine

## 2017-01-08 ENCOUNTER — Ambulatory Visit (INDEPENDENT_AMBULATORY_CARE_PROVIDER_SITE_OTHER): Payer: BLUE CROSS/BLUE SHIELD | Admitting: Family Medicine

## 2017-01-08 VITALS — BP 136/96 | HR 80 | Temp 98.4°F | Ht 65.0 in | Wt 244.0 lb

## 2017-01-08 DIAGNOSIS — E669 Obesity, unspecified: Secondary | ICD-10-CM | POA: Insufficient documentation

## 2017-01-08 DIAGNOSIS — Z6841 Body Mass Index (BMI) 40.0 and over, adult: Secondary | ICD-10-CM | POA: Diagnosis not present

## 2017-01-08 DIAGNOSIS — Z124 Encounter for screening for malignant neoplasm of cervix: Secondary | ICD-10-CM

## 2017-01-08 DIAGNOSIS — Z01419 Encounter for gynecological examination (general) (routine) without abnormal findings: Secondary | ICD-10-CM

## 2017-01-08 DIAGNOSIS — Z23 Encounter for immunization: Secondary | ICD-10-CM | POA: Diagnosis not present

## 2017-01-08 DIAGNOSIS — Z0000 Encounter for general adult medical examination without abnormal findings: Secondary | ICD-10-CM | POA: Diagnosis not present

## 2017-01-08 LAB — CBC WITH DIFFERENTIAL/PLATELET
BASOS ABS: 0.1 10*3/uL (ref 0.0–0.1)
Basophils Relative: 0.9 % (ref 0.0–3.0)
EOS ABS: 0 10*3/uL (ref 0.0–0.7)
Eosinophils Relative: 0.4 % (ref 0.0–5.0)
HCT: 41.4 % (ref 36.0–46.0)
Hemoglobin: 13.5 g/dL (ref 12.0–15.0)
Lymphocytes Relative: 26.8 % (ref 12.0–46.0)
Lymphs Abs: 2.1 10*3/uL (ref 0.7–4.0)
MCHC: 32.6 g/dL (ref 30.0–36.0)
MCV: 86.7 fl (ref 78.0–100.0)
MONO ABS: 0.7 10*3/uL (ref 0.1–1.0)
Monocytes Relative: 8.7 % (ref 3.0–12.0)
NEUTROS ABS: 5 10*3/uL (ref 1.4–7.7)
NEUTROS PCT: 63.2 % (ref 43.0–77.0)
PLATELETS: 267 10*3/uL (ref 150.0–400.0)
RBC: 4.78 Mil/uL (ref 3.87–5.11)
RDW: 13.6 % (ref 11.5–15.5)
WBC: 7.9 10*3/uL (ref 4.0–10.5)

## 2017-01-08 LAB — LIPID PANEL
CHOL/HDL RATIO: 4
Cholesterol: 216 mg/dL — ABNORMAL HIGH (ref 0–200)
HDL: 53.6 mg/dL (ref >39.00)
LDL CALC: 148 mg/dL — AB (ref 0–99)
NonHDL: 162.24
Triglycerides: 70 mg/dL (ref 0.0–149.0)
VLDL: 14 mg/dL (ref 0.0–40.0)

## 2017-01-08 LAB — COMPREHENSIVE METABOLIC PANEL
ALT: 13 U/L (ref 0–35)
AST: 16 U/L (ref 0–37)
Albumin: 4 g/dL (ref 3.5–5.2)
Alkaline Phosphatase: 88 U/L (ref 39–117)
BILIRUBIN TOTAL: 0.6 mg/dL (ref 0.2–1.2)
BUN: 12 mg/dL (ref 6–23)
CHLORIDE: 103 meq/L (ref 96–112)
CO2: 26 meq/L (ref 19–32)
CREATININE: 0.82 mg/dL (ref 0.40–1.20)
Calcium: 9.5 mg/dL (ref 8.4–10.5)
GFR: 88.87 mL/min (ref >60.00)
GLUCOSE: 85 mg/dL (ref 70–99)
Potassium: 3.8 mEq/L (ref 3.5–5.1)
Sodium: 138 mEq/L (ref 135–145)
Total Protein: 6.8 g/dL (ref 6.0–8.3)

## 2017-01-08 LAB — TSH: TSH: 3.25 u[IU]/mL (ref 0.35–4.50)

## 2017-01-08 MED ORDER — PHENTERMINE HCL 15 MG PO CAPS: 15.0000 mg | ORAL_CAPSULE | ORAL | 2 refills | Status: DC

## 2017-01-08 NOTE — Assessment & Plan Note (Signed)
Pap smear today. 

## 2017-01-08 NOTE — Telephone Encounter (Signed)
Initiated PA for Phentermine 15mg  Caps via CMM/thx dmf

## 2017-01-08 NOTE — Assessment & Plan Note (Signed)
Reviewed preventive care protocols, scheduled due services, and updated immunizations Discussed nutrition, exercise, diet, and healthy lifestyle.  Orders Placed This Encounter  Procedures  . Tdap vaccine greater than or equal to 27yo IM  . CBC with Differential/Platelet  . Comprehensive metabolic panel  . Lipid panel  . TSH    

## 2017-01-08 NOTE — Patient Instructions (Signed)
Great to see you. We will call you with your results from today and you can view them online. 

## 2017-01-08 NOTE — Progress Notes (Signed)
Subjective:    Patient ID: Regina Kidd, female    DOB: 10-09-1990, 26 y.o.   MRN: [id number]  HPI  Very pleasant 26 yo G0 here for CPX.  Currently sexually active with one partner, uses condoms and OCPs. Periods are now regular on OCPs.     Denies any vaginal discharge, dysuria or other symptoms.  Last pap smear was done by me on 12/23/15- neg.  Did have pap that was positive for LSIL with positive HPV on 08/17/2011.  No family h/o cervical CA or ovarian CA.  Obesity- wants a trial of phentermine to help with weight loss.  She and her husband have changed the way they eat but she still can't seem to lose weight.  Lab Results  Component Value Date   CHOL 210 (H) 12/23/2015   HDL 51.00 12/23/2015   LDLCALC 129 (H) 12/23/2015   TRIG 154.0 (H) 12/23/2015   CHOLHDL 4 12/23/2015   Lab Results  Component Value Date   CREATININE 0.85 12/23/2015   Lab Results  Component Value Date   WBC 8.6 12/23/2015    Patient Active Problem List   Diagnosis Date Noted  . Routine general medical examination at a health care facility 10/23/2013  . Encounter for routine gynecological examination 10/23/2013   No past medical history on file. No past surgical history on file. Social History   Tobacco Use  . Smoking status: Never Smoker  . Smokeless tobacco: Never Used  Substance Use Topics  . Alcohol use: Yes    Alcohol/week: 0.6 oz    Types: 1 Cans of beer per week    Comment: occas  . Drug use: No   No family history on file. No Known Allergies Current Outpatient Medications on File Prior to Visit  Medication Sig Dispense Refill  . Norgestimate-Ethinyl Estradiol Triphasic (TRI-PREVIFEM) 0.18/0.215/0.25 MG-35 MCG tablet Take 1 tablet by mouth daily. 3 Package 3   No current facility-administered medications on file prior to visit.    The PMH, PSH, Social History, Family History, Medications, and allergies have been reviewed in [facility], and have been updated if  relevant.    Review of Systems  Constitutional: Negative.   HENT: Negative.   Eyes: Negative.   Respiratory: Negative.   Cardiovascular: Negative.   Gastrointestinal: Negative.   Endocrine: Negative.   Genitourinary: Negative.   Musculoskeletal: Negative.   Skin: Negative.   Allergic/Immunologic: Negative.   Neurological: Negative.   Hematological: Negative.   Psychiatric/Behavioral: Negative.   All other systems reviewed and are negative.        Objective:   Physical Exam BP (!) 136/96 (BP Location: Left Arm, Patient Position: Sitting, Cuff Size: Large)   Pulse 80   Temp 98.4 F (36.9 C) (Oral)   Ht 5\' 5"  (1.651 m)   Wt 244 lb (110.7 kg)   SpO2 98%   BMI 40.60 kg/m    General:  Well-developed,well-nourished,in no acute distress; alert,appropriate and cooperative throughout examination Head:  normocephalic and atraumatic.   Eyes:  vision grossly intact, PERRL Ears:  R ear normal and L ear normal externally, TMs clear bilaterally Nose:  no external deformity.   Mouth:  good dentition.   Neck:  No deformities, masses, or tenderness noted. Breasts:  No mass, nodules, thickening, tenderness, bulging, retraction, inflamation, nipple discharge or skin changes noted.   Lungs:  Normal respiratory effort, chest expands symmetrically. Lungs are clear to auscultation, no crackles or wheezes. Heart:  Normal rate and regular rhythm. S1 and S2  normal without gallop, murmur, click, rub or other extra sounds. Abdomen:  Bowel sounds positive,abdomen soft and non-tender without masses, organomegaly or hernias noted. Rectal:  no external abnormalities.   Genitalia:  Pelvic Exam:        External: normal female genitalia without lesions or masses        Vagina: normal without lesions or masses        Cervix: normal without lesions or masses        Adnexa: normal bimanual exam without masses or fullness        Uterus: normal by palpation        Pap smear: performed Msk:  No  deformity or scoliosis noted of thoracic or lumbar spine.   Extremities:  No clubbing, cyanosis, edema, or deformity noted with normal full range of motion of all joints.   Neurologic:  alert & oriented X3 and gait normal.   Skin:  Intact without suspicious lesions or rashes Cervical Nodes:  No lymphadenopathy noted Axillary Nodes:  No palpable lymphadenopathy Psych:  Cognition and judgment appear intact. Alert and cooperative with normal attention span and concentration. No apparent delusions, illusions, hallucinations         Assessment & Plan:

## 2017-01-08 NOTE — Assessment & Plan Note (Signed)
Discussed weight loss plan.  Pt would also like to discuss medication options- discussed phentermine risk benefits, side effects including HTN, pulmonary HTN, stroke.    She would like to start phentermine and lifestyle changes.  Follow up in 1 month.  If BMI < 27 will decrease to half dose x 1 month then stop   

## 2017-01-09 ENCOUNTER — Other Ambulatory Visit (HOSPITAL_COMMUNITY)
Admission: RE | Admit: 2017-01-09 | Discharge: 2017-01-09 | Disposition: A | Discharge status: Home / Self Care | Payer: BLUE CROSS/BLUE SHIELD | Source: Ambulatory Visit | Attending: Family Medicine | Admitting: Family Medicine

## 2017-01-09 DIAGNOSIS — Z124 Encounter for screening for malignant neoplasm of cervix: Secondary | ICD-10-CM | POA: Diagnosis not present

## 2017-01-09 DIAGNOSIS — R87610 Atypical squamous cells of undetermined significance on cytologic smear of cervix (ASC-US): Secondary | ICD-10-CM | POA: Diagnosis not present

## 2017-01-09 DIAGNOSIS — Z01419 Encounter for gynecological examination (general) (routine) without abnormal findings: Secondary | ICD-10-CM | POA: Diagnosis not present

## 2017-01-09 NOTE — Addendum Note (Signed)
Addended by: Lerry LinerFREDERICK, Caylynn Minchew. M on: 01/09/2017 09:18 AM   Modules accepted: Orders

## 2017-01-10 NOTE — Telephone Encounter (Signed)
[employer] declined to cover Phentermine 15mg  capsules/thx dmf

## 2017-01-11 LAB — CYTOLOGY - PAP
Bacterial vaginitis: NEGATIVE
Candida vaginitis: NEGATIVE
Chlamydia: NEGATIVE
DIAGNOSIS: UNDETERMINED — AB
HPV: NOT DETECTED
Neisseria Gonorrhea: NEGATIVE
TRICH (WINDOWPATH): NEGATIVE

## 2017-01-15 LAB — CERVICOVAGINAL ANCILLARY ONLY: HERPES (WINDOWPATH): NEGATIVE

## 2017-01-20 ENCOUNTER — Other Ambulatory Visit: Payer: Self-pay | Admitting: Family Medicine

## 2017-07-04 ENCOUNTER — Ambulatory Visit: Payer: BLUE CROSS/BLUE SHIELD | Admitting: Family Medicine

## 2017-07-05 ENCOUNTER — Ambulatory Visit (INDEPENDENT_AMBULATORY_CARE_PROVIDER_SITE_OTHER): Payer: BLUE CROSS/BLUE SHIELD | Admitting: Family Medicine

## 2017-07-05 ENCOUNTER — Encounter: Payer: Self-pay | Admitting: Family Medicine

## 2017-07-05 VITALS — BP 114/78 | HR 80 | Temp 98.6°F | Ht 65.0 in | Wt 231.8 lb

## 2017-07-05 MED ORDER — PHENTERMINE HCL 15 MG PO CAPS: 15.0000 mg | ORAL_CAPSULE | ORAL | 2 refills | Status: DC

## 2017-07-05 NOTE — Assessment & Plan Note (Signed)
She is doing well with current dose of phentermine.  Encouraged her to continue with daily exercise and dietary changes.  Follow up in 3 months.  If BMI < 27 will decrease to half dose x 1 month then stop

## 2017-07-05 NOTE — Patient Instructions (Signed)
Great to see you. Please come see me in 3 months. 

## 2017-07-05 NOTE — Progress Notes (Signed)
Subjective:   Patient ID: Regina Kidd, female    DOB: [birth year], 27 y.o.   MRN: [id number]  Regina Kidd is a pleasant 27 y.o. year old female who presents to clinic today with Weight Loss (Patient is here today to discuss restarting Phentermine.  She used it for one month before the holidays and lost weight and has been able to maintain that.  At November OV she weighed 244 lbs and now weighs 231 lbs.)  on 07/05/2017  HPI: Obesity-  Has been doing well with phentermine.  Has kept the weight off through the holidays and continues to lose more.  Now exercising regularly as well.  She has more energy.  Feels great. Denies any CP, palpitations, insomnia or SOB.   No current outpatient medications on file prior to visit.   No current facility-administered medications on file prior to visit.     No Known Allergies  No past medical history on file.  No past surgical history on file.  No family history on file.  Social History   Socioeconomic History  . Marital status: Single    Spouse name: Not on file  . Number of children: Not on file  . Years of education: Not on file  . Highest education level: Not on file  Occupational History  . Not on file  Social Needs  . Financial resource strain: Not on file  . Food insecurity:    Worry: Not on file    Inability: Not on file  . Transportation needs:    Medical: Not on file    Non-medical: Not on file  Tobacco Use  . Smoking status: Never Smoker  . Smokeless tobacco: Never Used  Substance and Sexual Activity  . Alcohol use: Yes    Alcohol/week: 0.6 oz    Types: 1 Cans of beer per week    Comment: occas  . Drug use: No  . Sexual activity: Yes    Partners: Male    Birth control/protection: Pill  Lifestyle  . Physical activity:    Days per week: Not on file    Minutes per session: Not on file  . Stress: Not on file  Relationships  . Social connections:    Talks on phone: Not on file    Gets together: Not on  file    Attends religious service: Not on file    Active member of club or organization: Not on file    Attends meetings of clubs or organizations: Not on file    Relationship status: Not on file  . Intimate partner violence:    Fear of current or ex partner: Not on file    Emotionally abused: Not on file    Physically abused: Not on file    Forced sexual activity: Not on file  Other Topics Concern  . Not on file  Social History Narrative  . Not on file   The PMH, PSH, Social History, Family History, Medications, and allergies have been reviewed in [facility], and have been updated if relevant.    Review of Systems  Constitutional: Negative.   Respiratory: Negative.   Cardiovascular: Negative.   Musculoskeletal: Negative.   Neurological: Negative.   Hematological: Negative.   Psychiatric/Behavioral: Negative.   All other systems reviewed and are negative.      Objective:    BP 114/78 (BP Location: Left Arm, Patient Position: Sitting, Cuff Size: Normal)   Pulse 80   Temp 98.6 F (37 C) (Oral)  Ht  (1.651 m)   Wt 231 lb 12.8 oz (105.1 kg)   LMP 06/05/2017   SpO2 97%   BMI 38.57 kg/m    Physical Exam  Constitutional: She appears well-developed and well-nourished. No distress.  HENT:  Head: Normocephalic and atraumatic.  Eyes: EOM are normal.  Neck: Normal range of motion.  Cardiovascular: Normal rate and regular rhythm.  Pulmonary/Chest: Effort normal and breath sounds normal.  Musculoskeletal: Normal range of motion. She exhibits no edema.  Skin: Skin is warm. She is not diaphoretic.  Psychiatric: She has a normal mood and affect. Her behavior is normal. Judgment and thought content normal.  Nursing note and vitals reviewed.   Wt Readings from Last 3 Encounters:  07/05/17 231 lb 12.8 oz (105.1 kg)  01/08/17 244 lb (110.7 kg)  12/23/15 230 lb (104.3 kg)          Assessment & Plan:   Class 2 severe obesity due to excess calories with serious  comorbidity in adult, unspecified BMI (HCC) No follow-ups on file.

## 2017-09-11 ENCOUNTER — Telehealth: Payer: Self-pay

## 2017-09-11 DIAGNOSIS — Z3201 Encounter for pregnancy test, result positive: Secondary | ICD-10-CM

## 2017-09-11 NOTE — Telephone Encounter (Signed)
Copied from CRM [phone]. Topic: Inquiry >> Sep 11, 2017  7:49 AM Yvonna Alanisobinson, Andra M wrote: Reason for CRM: Patient has requested a call back from Dr. Dayton MartesAron concerning a possible pregnancy. Please call patient this morning.       Thank You!!!

## 2017-09-11 NOTE — Addendum Note (Signed)
Addended by: Lerry LinerFREDERICK, Rishi Vicario. M on: 09/11/2017 03:03 PM   Modules accepted: Orders

## 2017-09-11 NOTE — Telephone Encounter (Signed)
TA-Per our conversation pt is coming in the am at 7:30 for quant & qual HCG lab draw/thx dmf

## 2017-09-12 ENCOUNTER — Other Ambulatory Visit (INDEPENDENT_AMBULATORY_CARE_PROVIDER_SITE_OTHER): Payer: BLUE CROSS/BLUE SHIELD

## 2017-09-12 DIAGNOSIS — Z3201 Encounter for pregnancy test, result positive: Secondary | ICD-10-CM

## 2017-09-12 LAB — HCG, QUANTITATIVE, PREGNANCY: QUANTITATIVE HCG: 71.29 m[IU]/mL

## 2017-09-13 LAB — HCG, SERUM, QUALITATIVE: Preg, Serum: POSITIVE — AB

## 2017-09-18 ENCOUNTER — Ambulatory Visit: Payer: Self-pay | Admitting: Family Medicine

## 2017-09-18 NOTE — Telephone Encounter (Signed)
Pt who is [redacted] weeks pregnant calling with vaginal bleeding. Pt stated that she had brown discharge yesterday and today when she wiped today the blood was red and had clots. Today she stated that the spotting is mild. She stated that she is having mild light pressure to her lower abdomen that comes and goes. Her LMP was June 11th. Denies weakness or feeling lightheaded. Pt denies passed tissue, fever, or menstrual type cramps. Care advice given and pt verbalized understanding. Appointment given for 1100 tomorrow. Called Beryl Hornberger at practice and ok with 30 minute appt. Emotional support given.  Reason for Disposition . MILD vaginal bleeding (i.e., less than 1 pad / hour; less than patient's usual menstrual bleeding; not just spotting)  Answer Assessment - Initial Assessment Questions 1. ONSET: "When did this bleeding start?"       Yesterday with brown discharge 2. DESCRIPTION: "Describe the bleeding that you are having." "How much bleeding is there?"    - SPOTTING: spotting, or pinkish / brownish mucous discharge; does not fill panti-liner or pad    - MILD:  less than 1 pad / hour; less than patient's usual menstrual bleeding   - MODERATE: 1-2 pads / hour; small-medium blood clots (e.g., pea, grape, small coin)    - SEVERE: soaking 2 or more pads/hour for 2 or more hours; bleeding not contained by pads or continuous red blood from vagina; large blood clots (e.g., golf ball, large coin)      mild 3. ABDOMINAL PAIN SEVERITY: If present, ask: "How bad is it?"  (e.g., Scale 1-10; mild, moderate, or severe)   - MILD (1-3): doesn't interfere with normal activities, abdomen soft and not tender to touch    - MODERATE (4-7): interferes with normal activities or awakens from sleep, tender to touch    - SEVERE (8-10): excruciating pain, doubled over, unable to do any normal activities     Mild light pressure lower abdomen- comes and goes 4. PREGNANCY: "Do you know how many weeks or months pregnant you are?" "When  was the first day of your last normal menstrual period?"     Yes- 5 weeks-June 11 th 5. HEMODYNAMIC STATUS: "Are you weak or feeling lightheaded?" If so, ask: "Can you stand and walk normally?"      No- yes 6. OTHER SYMPTOMS: "What other symptoms are you having with the bleeding?" (e.g., passed tissue, vaginal discharge, fever, menstrual-type cramps)     no  Protocols used: PREGNANCY - VAGINAL BLEEDING LESS THAN [redacted] WEEKS EGA-A-AH

## 2017-09-19 ENCOUNTER — Ambulatory Visit (INDEPENDENT_AMBULATORY_CARE_PROVIDER_SITE_OTHER): Payer: BLUE CROSS/BLUE SHIELD | Admitting: Family Medicine

## 2017-09-19 ENCOUNTER — Other Ambulatory Visit: Payer: Self-pay | Admitting: Family Medicine

## 2017-09-19 ENCOUNTER — Encounter: Payer: Self-pay | Admitting: Family Medicine

## 2017-09-19 ENCOUNTER — Ambulatory Visit
Admission: RE | Admit: 2017-09-19 | Discharge: 2017-09-19 | Disposition: A | Discharge status: Home / Self Care | Payer: BLUE CROSS/BLUE SHIELD | Source: Ambulatory Visit | Attending: Family Medicine | Admitting: Family Medicine

## 2017-09-19 VITALS — BP 112/64 | HR 81 | Temp 98.8°F | Ht 65.0 in | Wt 233.8 lb

## 2017-09-19 DIAGNOSIS — Z3A00 Weeks of gestation of pregnancy not specified: Secondary | ICD-10-CM | POA: Diagnosis not present

## 2017-09-19 DIAGNOSIS — O209 Hemorrhage in early pregnancy, unspecified: Secondary | ICD-10-CM | POA: Insufficient documentation

## 2017-09-19 DIAGNOSIS — Z3A01 Less than 8 weeks gestation of pregnancy: Secondary | ICD-10-CM | POA: Diagnosis not present

## 2017-09-19 LAB — HCG, QUANTITATIVE, PREGNANCY: Quantitative HCG: 8.08 m[IU]/mL

## 2017-09-19 NOTE — Assessment & Plan Note (Signed)
Based on the heaviness of the bleeding (heavy clots as well), likely spontaneous abortion has occurred/is occurring. >25 minutes spent in face to face time with patient, >50% spent in counselling or coordination of care.  She is understandably disappointed .I did reassure her if this is a miscarriage (which it likely is), it does not at all indicate that she will have future trouble conceiving or carrying a baby to term.   I ordered a stat hcg quant and discussed with her that we will need to follow this lab to make sure it continues to trend down.  I have ordered a stat transvaginal US as well for confirmation/ to rule out retained products of conception. The patient indicates understanding of these issues and agrees with the plan.

## 2017-09-19 NOTE — Progress Notes (Signed)
Subjective:   Patient ID: Regina HostellerLauren N Kiner, female    DOB: Apr 21, 1990, 27 y.o.   MRN: [id number]  Regina Kidd is a pleasant 27 y.o. year old female who presents to clinic today with Vaginal Bleeding (Patient is here today due to spotting and she is approximately  5-weeks-gestation.  It was light brown on Monday, yesterday was "pretty heavy all day and into the night," and today is only when she wipes.  Intermittent mild light pressure in hypogastric region without cramping.  HCG Quant was 71.29 on 7.10.19.)  on 09/19/2017  HPI:  Vaginal bleeding- G1p0-she is approximately [redacted] weeks pregnant. bhcg quant was 71.29 on 09/12/17. Has never been pregnant or tried to conceive in past.  Started noticing some light brown discharge/spotting two days ago, yesterday it became bloody and with clots "pretty heavy and into the night."  Today she only sees it when she wipes. Some light pressures in her suprapubic area but no cramping. She did have some cramping yesterday.  Has not yet had her first visit with OB.    Current Outpatient Medications on File Prior to Visit  Medication Sig Dispense Refill  . Prenatal Vit-Fe Fumarate-FA (MULTIVITAMIN-PRENATAL) 27-0.8 MG TABS tablet Take 1 tablet by mouth daily at 12 noon.     No current facility-administered medications on file prior to visit.     No Known Allergies  No past medical history on file.  No past surgical history on file.  No family history on file.  Social History   Socioeconomic History  . Marital status: Single    Spouse name: Not on file  . Number of children: Not on file  . Years of education: Not on file  . Highest education level: Not on file  Occupational History  . Not on file  Social Needs  . Financial resource strain: Not on file  . Food insecurity:    Worry: Not on file    Inability: Not on file  . Transportation needs:    Medical: Not on file    Non-medical: Not on file  Tobacco Use  . Smoking status: Never  Smoker  . Smokeless tobacco: Never Used  Substance and Sexual Activity  . Alcohol use: Yes    Alcohol/week: 0.6 oz    Types: 1 Cans of beer per week    Comment: occas  . Drug use: No  . Sexual activity: Yes    Partners: Male    Birth control/protection: Pill  Lifestyle  . Physical activity:    Days per week: Not on file    Minutes per session: Not on file  . Stress: Not on file  Relationships  . Social connections:    Talks on phone: Not on file    Gets together: Not on file    Attends religious service: Not on file    Active member of club or organization: Not on file    Attends meetings of clubs or organizations: Not on file    Relationship status: Not on file  . Intimate partner violence:    Fear of current or ex partner: Not on file    Emotionally abused: Not on file    Physically abused: Not on file    Forced sexual activity: Not on file  Other Topics Concern  . Not on file  Social History Narrative  . Not on file   The PMH, PSH, Social History, Family History, Medications, and allergies [facility], and have been updated  if relevant.   Review of Systems  Constitutional: Negative.   Gastrointestinal: Positive for abdominal pain.  Genitourinary: Positive for vaginal bleeding and vaginal discharge. Negative for pelvic pain and vaginal pain.  Musculoskeletal: Negative.   Skin: Negative.   Neurological: Negative.   Hematological: Negative.   Psychiatric/Behavioral: Negative.   All other systems reviewed and are negative.      Objective:    BP 112/64 (BP Location: Right Arm, Patient Position: Sitting, Cuff Size: Normal)   Pulse 81   Temp 98.8 F (37.1 C) (Oral)   Ht 5\' 5"  (1.651 m)   Wt 233 lb 12.8 oz (106.1 kg)   LMP 08/14/2017   SpO2 97%   BMI 38.91 kg/m    Physical Exam  Constitutional: She is oriented to person, place, and time. She appears well-developed and well-nourished. No distress.  HENT:  Head: Normocephalic and atraumatic.    Neck: Normal range of motion.  Cardiovascular: Normal rate.  Pulmonary/Chest: Effort normal.  Neurological: She is alert and oriented to person, place, and time. No cranial nerve deficit.  Skin: Skin is warm and dry. She is not diaphoretic.  Psychiatric:  Appropriately tearful  Nursing note and vitals reviewed.         Assessment & Plan:   Vaginal bleeding in pregnant patient at less than [redacted] weeks gestation - Plan: B-HCG Quant No follow-ups on file.

## 2017-09-19 NOTE — Addendum Note (Signed)
Addended by: Dianne DunARON, TALIA M on: 09/19/2017 11:59 AM   Modules accepted: Orders

## 2017-09-21 ENCOUNTER — Other Ambulatory Visit (INDEPENDENT_AMBULATORY_CARE_PROVIDER_SITE_OTHER): Payer: BLUE CROSS/BLUE SHIELD

## 2017-09-21 DIAGNOSIS — O209 Hemorrhage in early pregnancy, unspecified: Secondary | ICD-10-CM | POA: Diagnosis not present

## 2017-09-21 LAB — HCG, QUANTITATIVE, PREGNANCY: QUANTITATIVE HCG: 2.22 m[IU]/mL

## 2017-10-08 ENCOUNTER — Ambulatory Visit: Payer: BLUE CROSS/BLUE SHIELD | Admitting: Family Medicine

## 2017-10-26 ENCOUNTER — Other Ambulatory Visit (INDEPENDENT_AMBULATORY_CARE_PROVIDER_SITE_OTHER): Payer: BLUE CROSS/BLUE SHIELD

## 2017-10-26 ENCOUNTER — Other Ambulatory Visit: Payer: Self-pay

## 2017-10-26 DIAGNOSIS — Z3201 Encounter for pregnancy test, result positive: Secondary | ICD-10-CM

## 2017-10-26 NOTE — Addendum Note (Signed)
Addended by: Lerry LinerFREDERICK, Meklit Cotta. M on: 10/26/2017 04:05 PM   Modules accepted: Orders

## 2017-10-27 LAB — HCG, QUANTITATIVE, PREGNANCY: HCG, Total, QN: 2703 m[IU]/mL

## 2017-11-13 DIAGNOSIS — N911 Secondary amenorrhea: Secondary | ICD-10-CM | POA: Diagnosis not present

## 2017-11-13 DIAGNOSIS — Z3201 Encounter for pregnancy test, result positive: Secondary | ICD-10-CM | POA: Diagnosis not present

## 2017-11-13 DIAGNOSIS — Z3A01 Less than 8 weeks gestation of pregnancy: Secondary | ICD-10-CM | POA: Diagnosis not present

## 2017-11-13 DIAGNOSIS — Z23 Encounter for immunization: Secondary | ICD-10-CM | POA: Diagnosis not present

## 2017-11-13 DIAGNOSIS — O26891 Other specified pregnancy related conditions, first trimester: Secondary | ICD-10-CM | POA: Diagnosis not present

## 2017-12-06 DIAGNOSIS — Z3A10 10 weeks gestation of pregnancy: Secondary | ICD-10-CM | POA: Diagnosis not present

## 2017-12-06 DIAGNOSIS — Z113 Encounter for screening for infections with a predominantly sexual mode of transmission: Secondary | ICD-10-CM | POA: Diagnosis not present

## 2017-12-06 DIAGNOSIS — Z3481 Encounter for supervision of other normal pregnancy, first trimester: Secondary | ICD-10-CM | POA: Diagnosis not present

## 2017-12-06 DIAGNOSIS — Z3689 Encounter for other specified antenatal screening: Secondary | ICD-10-CM | POA: Diagnosis not present

## 2017-12-06 DIAGNOSIS — Z368A Encounter for antenatal screening for other genetic defects: Secondary | ICD-10-CM | POA: Diagnosis not present

## 2017-12-06 DIAGNOSIS — O26891 Other specified pregnancy related conditions, first trimester: Secondary | ICD-10-CM | POA: Diagnosis not present

## 2017-12-06 LAB — OB RESULTS CONSOLE ABO/RH: RH Type: NEGATIVE

## 2017-12-06 LAB — OB RESULTS CONSOLE GC/CHLAMYDIA
Chlamydia: NEGATIVE
Gonorrhea: NEGATIVE

## 2017-12-06 LAB — OB RESULTS CONSOLE HEPATITIS B SURFACE ANTIGEN: Hepatitis B Surface Ag: NEGATIVE

## 2017-12-06 LAB — OB RESULTS CONSOLE RUBELLA ANTIBODY, IGM: Rubella: IMMUNE

## 2017-12-06 LAB — OB RESULTS CONSOLE ANTIBODY SCREEN: Antibody Screen: NEGATIVE

## 2017-12-06 LAB — OB RESULTS CONSOLE RPR: RPR: NONREACTIVE

## 2017-12-06 LAB — OB RESULTS CONSOLE HIV ANTIBODY (ROUTINE TESTING): HIV: NONREACTIVE

## 2017-12-17 DIAGNOSIS — Z3A12 12 weeks gestation of pregnancy: Secondary | ICD-10-CM | POA: Diagnosis not present

## 2017-12-17 DIAGNOSIS — Z3682 Encounter for antenatal screening for nuchal translucency: Secondary | ICD-10-CM | POA: Diagnosis not present

## 2018-01-29 DIAGNOSIS — Z3689 Encounter for other specified antenatal screening: Secondary | ICD-10-CM | POA: Diagnosis not present

## 2018-01-29 DIAGNOSIS — Z3A18 18 weeks gestation of pregnancy: Secondary | ICD-10-CM | POA: Diagnosis not present

## 2018-01-29 DIAGNOSIS — Z363 Encounter for antenatal screening for malformations: Secondary | ICD-10-CM | POA: Diagnosis not present

## 2018-02-13 DIAGNOSIS — Z362 Encounter for other antenatal screening follow-up: Secondary | ICD-10-CM | POA: Diagnosis not present

## 2018-02-13 DIAGNOSIS — Z3A20 20 weeks gestation of pregnancy: Secondary | ICD-10-CM | POA: Diagnosis not present

## 2018-03-06 NOTE — L&D Delivery Note (Signed)
Operative Delivery Note The patient labored down to a +1-+2 station and the FHR recovered to a category 1 tracing, so we began pushing at 1130am.    The patient made great progress and brought the vertex to +3 station.  There was a persistent FHR deceleration to the 60-70's and the patient could not quite deliver the vertex so she was counseled and agreed to vacuum assisted delivery.  The soft cup vacuum was applied at a +3 station and the vertex delivered in one easy pull with no pop-offs.  At 12:43 PM a healthy female was delivered via Vaginal, Spontaneous.  Presentation: vertex; Position: Right,, Occiput,, Anterior; Station: +3.  Verbal consent: obtained from patient.  Risks and benefits discussed in detail.  Risks include, but are not limited to the risks of anesthesia, bleeding, infection, damage to maternal tissues, fetal cephalhematoma.  There is also the risk of inability to effect vaginal delivery of the head, or shoulder dystocia that cannot be resolved by established maneuvers, leading to the need for emergency cesarean section.  APGAR: 8, 8; weight pending  .   Placenta status: delivered spontaneously .   Cord:  with the following complications:nuchal x 1 delivered through .   Anesthesia:  epidural Instruments: Soft cup vacuum Episiotomy:  none Lacerations: left sulcal  Suture Repair: 3.0 vicryl rapide Est. Blood Loss (mL):   Mom to postpartum.  Baby to Couplet care / Skin to Skin. Baby grunting a little after delivery, so pulse ox applied and will be followed closely.  Will d/c antibiotics after 2 more doses.  Oliver Pila 06/28/2018, 1:23 PM

## 2018-04-10 DIAGNOSIS — Z23 Encounter for immunization: Secondary | ICD-10-CM | POA: Diagnosis not present

## 2018-04-10 DIAGNOSIS — Z6711 Type A blood, Rh negative: Secondary | ICD-10-CM | POA: Diagnosis not present

## 2018-04-10 DIAGNOSIS — Z3689 Encounter for other specified antenatal screening: Secondary | ICD-10-CM | POA: Diagnosis not present

## 2018-05-03 DIAGNOSIS — O403XX0 Polyhydramnios, third trimester, not applicable or unspecified: Secondary | ICD-10-CM | POA: Diagnosis not present

## 2018-05-03 DIAGNOSIS — O09213 Supervision of pregnancy with history of pre-term labor, third trimester: Secondary | ICD-10-CM | POA: Diagnosis not present

## 2018-05-03 DIAGNOSIS — O3663X0 Maternal care for excessive fetal growth, third trimester, not applicable or unspecified: Secondary | ICD-10-CM | POA: Diagnosis not present

## 2018-05-03 DIAGNOSIS — Z3A32 32 weeks gestation of pregnancy: Secondary | ICD-10-CM | POA: Diagnosis not present

## 2018-05-03 DIAGNOSIS — Z3A31 31 weeks gestation of pregnancy: Secondary | ICD-10-CM | POA: Diagnosis not present

## 2018-05-07 DIAGNOSIS — O403XX0 Polyhydramnios, third trimester, not applicable or unspecified: Secondary | ICD-10-CM | POA: Diagnosis not present

## 2018-05-07 DIAGNOSIS — Z3A32 32 weeks gestation of pregnancy: Secondary | ICD-10-CM | POA: Diagnosis not present

## 2018-05-10 DIAGNOSIS — Z3A32 32 weeks gestation of pregnancy: Secondary | ICD-10-CM | POA: Diagnosis not present

## 2018-05-10 DIAGNOSIS — O403XX0 Polyhydramnios, third trimester, not applicable or unspecified: Secondary | ICD-10-CM | POA: Diagnosis not present

## 2018-05-14 DIAGNOSIS — Z3A33 33 weeks gestation of pregnancy: Secondary | ICD-10-CM | POA: Diagnosis not present

## 2018-05-14 DIAGNOSIS — O403XX0 Polyhydramnios, third trimester, not applicable or unspecified: Secondary | ICD-10-CM | POA: Diagnosis not present

## 2018-05-17 DIAGNOSIS — O403XX0 Polyhydramnios, third trimester, not applicable or unspecified: Secondary | ICD-10-CM | POA: Diagnosis not present

## 2018-05-17 DIAGNOSIS — Z3A33 33 weeks gestation of pregnancy: Secondary | ICD-10-CM | POA: Diagnosis not present

## 2018-05-21 DIAGNOSIS — Z3A34 34 weeks gestation of pregnancy: Secondary | ICD-10-CM | POA: Diagnosis not present

## 2018-05-21 DIAGNOSIS — O403XX0 Polyhydramnios, third trimester, not applicable or unspecified: Secondary | ICD-10-CM | POA: Diagnosis not present

## 2018-05-21 DIAGNOSIS — O26843 Uterine size-date discrepancy, third trimester: Secondary | ICD-10-CM | POA: Diagnosis not present

## 2018-05-24 DIAGNOSIS — Z3A34 34 weeks gestation of pregnancy: Secondary | ICD-10-CM | POA: Diagnosis not present

## 2018-05-24 DIAGNOSIS — O403XX0 Polyhydramnios, third trimester, not applicable or unspecified: Secondary | ICD-10-CM | POA: Diagnosis not present

## 2018-05-24 DIAGNOSIS — O26843 Uterine size-date discrepancy, third trimester: Secondary | ICD-10-CM | POA: Diagnosis not present

## 2018-05-28 DIAGNOSIS — Z3A35 35 weeks gestation of pregnancy: Secondary | ICD-10-CM | POA: Diagnosis not present

## 2018-05-28 DIAGNOSIS — O403XX0 Polyhydramnios, third trimester, not applicable or unspecified: Secondary | ICD-10-CM | POA: Diagnosis not present

## 2018-05-31 DIAGNOSIS — Z3A35 35 weeks gestation of pregnancy: Secondary | ICD-10-CM | POA: Diagnosis not present

## 2018-05-31 DIAGNOSIS — O403XX0 Polyhydramnios, third trimester, not applicable or unspecified: Secondary | ICD-10-CM | POA: Diagnosis not present

## 2018-05-31 DIAGNOSIS — O409XX0 Polyhydramnios, unspecified trimester, not applicable or unspecified: Secondary | ICD-10-CM | POA: Diagnosis not present

## 2018-05-31 DIAGNOSIS — O3663X0 Maternal care for excessive fetal growth, third trimester, not applicable or unspecified: Secondary | ICD-10-CM | POA: Diagnosis not present

## 2018-06-05 DIAGNOSIS — Z3A36 36 weeks gestation of pregnancy: Secondary | ICD-10-CM | POA: Diagnosis not present

## 2018-06-05 DIAGNOSIS — O403XX0 Polyhydramnios, third trimester, not applicable or unspecified: Secondary | ICD-10-CM | POA: Diagnosis not present

## 2018-06-13 DIAGNOSIS — Z3685 Encounter for antenatal screening for Streptococcus B: Secondary | ICD-10-CM | POA: Diagnosis not present

## 2018-06-13 DIAGNOSIS — O403XX0 Polyhydramnios, third trimester, not applicable or unspecified: Secondary | ICD-10-CM | POA: Diagnosis not present

## 2018-06-13 DIAGNOSIS — O26843 Uterine size-date discrepancy, third trimester: Secondary | ICD-10-CM | POA: Diagnosis not present

## 2018-06-13 DIAGNOSIS — Z3A37 37 weeks gestation of pregnancy: Secondary | ICD-10-CM | POA: Diagnosis not present

## 2018-06-13 LAB — OB RESULTS CONSOLE GBS: GBS: POSITIVE

## 2018-06-20 DIAGNOSIS — Z3A38 38 weeks gestation of pregnancy: Secondary | ICD-10-CM | POA: Diagnosis not present

## 2018-06-20 DIAGNOSIS — O403XX0 Polyhydramnios, third trimester, not applicable or unspecified: Secondary | ICD-10-CM | POA: Diagnosis not present

## 2018-06-26 DIAGNOSIS — O403XX0 Polyhydramnios, third trimester, not applicable or unspecified: Secondary | ICD-10-CM | POA: Diagnosis not present

## 2018-06-26 DIAGNOSIS — Z3A39 39 weeks gestation of pregnancy: Secondary | ICD-10-CM | POA: Diagnosis not present

## 2018-06-27 ENCOUNTER — Inpatient Hospital Stay (HOSPITAL_COMMUNITY): Payer: BLUE CROSS/BLUE SHIELD | Admitting: Anesthesiology

## 2018-06-27 ENCOUNTER — Encounter (HOSPITAL_COMMUNITY): Payer: Self-pay | Admitting: *Deleted

## 2018-06-27 ENCOUNTER — Inpatient Hospital Stay (HOSPITAL_COMMUNITY)
Admission: AD | Admit: 2018-06-27 | Discharge: 2018-06-30 | DRG: 806 | Disposition: A | Discharge status: Home / Self Care | Payer: BLUE CROSS/BLUE SHIELD | Attending: Obstetrics and Gynecology | Admitting: Obstetrics and Gynecology

## 2018-06-27 ENCOUNTER — Other Ambulatory Visit: Payer: Self-pay

## 2018-06-27 DIAGNOSIS — O99824 Streptococcus B carrier state complicating childbirth: Secondary | ICD-10-CM | POA: Diagnosis not present

## 2018-06-27 DIAGNOSIS — O403XX0 Polyhydramnios, third trimester, not applicable or unspecified: Secondary | ICD-10-CM | POA: Diagnosis not present

## 2018-06-27 DIAGNOSIS — O26893 Other specified pregnancy related conditions, third trimester: Secondary | ICD-10-CM | POA: Diagnosis present

## 2018-06-27 DIAGNOSIS — E669 Obesity, unspecified: Secondary | ICD-10-CM | POA: Diagnosis not present

## 2018-06-27 DIAGNOSIS — Z3A39 39 weeks gestation of pregnancy: Secondary | ICD-10-CM

## 2018-06-27 DIAGNOSIS — Z3493 Encounter for supervision of normal pregnancy, unspecified, third trimester: Secondary | ICD-10-CM

## 2018-06-27 DIAGNOSIS — O99214 Obesity complicating childbirth: Secondary | ICD-10-CM | POA: Diagnosis not present

## 2018-06-27 DIAGNOSIS — Z6791 Unspecified blood type, Rh negative: Secondary | ICD-10-CM

## 2018-06-27 DIAGNOSIS — Z23 Encounter for immunization: Secondary | ICD-10-CM | POA: Diagnosis not present

## 2018-06-27 DIAGNOSIS — Z051 Observation and evaluation of newborn for suspected infectious condition ruled out: Secondary | ICD-10-CM | POA: Diagnosis not present

## 2018-06-27 DIAGNOSIS — O43893 Other placental disorders, third trimester: Secondary | ICD-10-CM | POA: Diagnosis not present

## 2018-06-27 LAB — ABO/RH: ABO/RH(D): A NEG

## 2018-06-27 LAB — CBC
HCT: 33.9 % — ABNORMAL LOW (ref 36.0–46.0)
Hemoglobin: 10.7 g/dL — ABNORMAL LOW (ref 12.0–15.0)
MCH: 26 pg (ref 26.0–34.0)
MCHC: 31.6 g/dL (ref 30.0–36.0)
MCV: 82.3 fL (ref 80.0–100.0)
Platelets: 243 10*3/uL (ref 150–400)
RBC: 4.12 MIL/uL (ref 3.87–5.11)
RDW: 15.5 % (ref 11.5–15.5)
WBC: 12.9 10*3/uL — ABNORMAL HIGH (ref 4.0–10.5)
nRBC: 0 % (ref 0.0–0.2)

## 2018-06-27 LAB — TYPE AND SCREEN
ABO/RH(D): A NEG
Antibody Screen: NEGATIVE

## 2018-06-27 MED ORDER — OXYTOCIN BOLUS FROM INFUSION
500.0000 mL | Freq: Once | INTRAVENOUS | Status: AC
  Administered 2018-06-28: 500 mL via INTRAVENOUS

## 2018-06-27 MED ORDER — SOD CITRATE-CITRIC ACID 500-334 MG/5ML PO SOLN: 30.0000 mL | ORAL | Status: DC | PRN

## 2018-06-27 MED ORDER — TERBUTALINE SULFATE 1 MG/ML IJ SOLN: 0.2500 mg | Freq: Once | INTRAMUSCULAR | Status: DC | PRN

## 2018-06-27 MED ORDER — ONDANSETRON HCL 4 MG/2ML IJ SOLN: 4.0000 mg | Freq: Four times a day (QID) | INTRAMUSCULAR | Status: DC | PRN

## 2018-06-27 MED ORDER — SODIUM CHLORIDE 0.9 % IV SOLN
5.0000 10*6.[IU] | Freq: Once | INTRAVENOUS | Status: AC
  Administered 2018-06-27: 5 10*6.[IU] via INTRAVENOUS
  Filled 2018-06-27: qty 5

## 2018-06-27 MED ORDER — EPHEDRINE 5 MG/ML INJ: 10.0000 mg | INTRAVENOUS | Status: DC | PRN

## 2018-06-27 MED ORDER — OXYTOCIN 40 UNITS IN NORMAL SALINE INFUSION - SIMPLE MED
2.5000 [IU]/h | INTRAVENOUS | Status: DC
  Administered 2018-06-28: 2.5 [IU]/h via INTRAVENOUS

## 2018-06-27 MED ORDER — LACTATED RINGERS IV SOLN
500.0000 mL | Freq: Once | INTRAVENOUS | Status: AC
  Administered 2018-06-27: 500 mL via INTRAVENOUS

## 2018-06-27 MED ORDER — FENTANYL-BUPIVACAINE-NACL 0.5-0.125-0.9 MG/250ML-% EP SOLN
12.0000 mL/h | EPIDURAL | Status: DC | PRN
  Administered 2018-06-28: 09:00:00 12 mL/h via EPIDURAL
  Filled 2018-06-27 (×2): qty 250

## 2018-06-27 MED ORDER — PENICILLIN G 3 MILLION UNITS IVPB - SIMPLE MED
3.0000 10*6.[IU] | INTRAVENOUS | Status: DC
  Administered 2018-06-27 – 2018-06-28 (×4): 3 10*6.[IU] via INTRAVENOUS
  Filled 2018-06-27 (×4): qty 100

## 2018-06-27 MED ORDER — PHENYLEPHRINE 40 MCG/ML (10ML) SYRINGE FOR IV PUSH (FOR BLOOD PRESSURE SUPPORT)
80.0000 ug | PREFILLED_SYRINGE | INTRAVENOUS | Status: DC | PRN
  Filled 2018-06-27: qty 10

## 2018-06-27 MED ORDER — LACTATED RINGERS IV SOLN: 500.0000 mL | Freq: Once | INTRAVENOUS | Status: DC

## 2018-06-27 MED ORDER — OXYTOCIN 40 UNITS IN NORMAL SALINE INFUSION - SIMPLE MED
1.0000 m[IU]/min | INTRAVENOUS | Status: DC
  Administered 2018-06-27: 2 m[IU]/min via INTRAVENOUS
  Administered 2018-06-27: 15:00:00 8 m[IU]/min via INTRAVENOUS
  Administered 2018-06-28: 6 m[IU]/min via INTRAVENOUS
  Filled 2018-06-27: qty 1000

## 2018-06-27 MED ORDER — LACTATED RINGERS IV SOLN
INTRAVENOUS | Status: DC
  Administered 2018-06-27 – 2018-06-28 (×4): via INTRAVENOUS

## 2018-06-27 MED ORDER — ACETAMINOPHEN 325 MG PO TABS: 650.0000 mg | ORAL_TABLET | ORAL | Status: DC | PRN

## 2018-06-27 MED ORDER — LIDOCAINE HCL (PF) 1 % IJ SOLN
INTRAMUSCULAR | Status: DC | PRN
  Administered 2018-06-27: 5 mL via EPIDURAL

## 2018-06-27 MED ORDER — OXYCODONE-ACETAMINOPHEN 5-325 MG PO TABS: 2.0000 | ORAL_TABLET | ORAL | Status: DC | PRN

## 2018-06-27 MED ORDER — PHENYLEPHRINE 40 MCG/ML (10ML) SYRINGE FOR IV PUSH (FOR BLOOD PRESSURE SUPPORT): 80.0000 ug | PREFILLED_SYRINGE | INTRAVENOUS | Status: DC | PRN

## 2018-06-27 MED ORDER — BUTORPHANOL TARTRATE 1 MG/ML IJ SOLN: 1.0000 mg | INTRAMUSCULAR | Status: DC | PRN

## 2018-06-27 MED ORDER — DIPHENHYDRAMINE HCL 50 MG/ML IJ SOLN: 12.5000 mg | INTRAMUSCULAR | Status: DC | PRN

## 2018-06-27 MED ORDER — LIDOCAINE HCL (PF) 1 % IJ SOLN: 30.0000 mL | INTRAMUSCULAR | Status: DC | PRN

## 2018-06-27 MED ORDER — LACTATED RINGERS IV SOLN
500.0000 mL | INTRAVENOUS | Status: DC | PRN
  Administered 2018-06-28: 500 mL via INTRAVENOUS

## 2018-06-27 MED ORDER — OXYCODONE-ACETAMINOPHEN 5-325 MG PO TABS: 1.0000 | ORAL_TABLET | ORAL | Status: DC | PRN

## 2018-06-27 MED ORDER — FENTANYL-BUPIVACAINE-NACL 0.5-0.125-0.9 MG/250ML-% EP SOLN: 12.0000 mL/h | EPIDURAL | Status: DC | PRN

## 2018-06-27 MED ORDER — SODIUM CHLORIDE (PF) 0.9 % IJ SOLN
INTRAMUSCULAR | Status: DC | PRN
  Administered 2018-06-27: 12 mL/h via EPIDURAL

## 2018-06-27 NOTE — Anesthesia Procedure Notes (Signed)
Epidural Patient location during procedure: OB Start time: 06/27/2018 7:18 PM End time: 06/27/2018 7:37 PM  Staffing Anesthesiologist: Trevor Iha, MD Performed: anesthesiologist   Preanesthetic Checklist Completed: patient identified, site marked, surgical consent, pre-op evaluation, timeout performed, IV checked, risks and benefits discussed and monitors and equipment checked  Epidural Patient position: sitting Prep: site prepped and draped and DuraPrep Patient monitoring: continuous pulse ox and blood pressure Approach: midline Location: L3-L4 Injection technique: LOR air  Needle:  Needle type: Tuohy  Needle gauge: 17 G Needle length: 9 cm and 9 Needle insertion depth: 5 cm cm Catheter type: closed end flexible Catheter size: 19 Gauge Catheter at skin depth: 15 cm Test dose: negative  Assessment Events: blood not aspirated, injection not painful, no injection resistance, negative IV test and no paresthesia  Additional Notes Patient identified. Risks/Benefits/Options discussed with patient including but not limited to bleeding, infection, nerve damage, paralysis, failed block, incomplete pain control, headache, blood pressure changes, nausea, vomiting, reactions to medication both or allergic, itching and postpartum back pain. Confirmed with bedside nurse the patient's most recent platelet count. Confirmed with patient that they are not currently taking any anticoagulation, have any bleeding history or any family history of bleeding disorders. Patient expressed understanding and wished to proceed. All questions were answered. Sterile technique was used throughout the entire procedure. Please see nursing notes for vital signs. Test dose was given through epidural needle and negative prior to continuing to dose epidural or start infusion. Warning signs of high block given to the patient including shortness of breath, tingling/numbness in hands, complete motor block, or any  concerning symptoms with instructions to call for help. Patient was given instructions on fall risk and not to get out of bed. All questions and concerns addressed with instructions to call with any issues. 3 Attempt (S) . Patient tolerated procedure well.

## 2018-06-27 NOTE — Progress Notes (Signed)
Patient ID: Regina Kidd, female   DOB: 06-16-1990, 28 y.o.   MRN: [id number]  Comfortable with epidural  AFVSS gen NAD FHTs 125's, mod var, occ earlies.  Category 1 toco Q 2-  SVE 4.5/80/-1/-2  Continue IOL /expect SVD Will change position to help baby descend

## 2018-06-27 NOTE — Progress Notes (Signed)
Patient ID: Regina Kidd, female   DOB: Jun 24, 1990, 28 y.o.   MRN: [id number]   Feeling ctx.    AFVSS gen NAD FHTs 130-140's, mod var, + accels, had prolonged decel with 3 close contractions.  Overall category 1 toco q  AROM for copious clear fluid, w/o diff/comp  Continue IOL and close monitoring.

## 2018-06-27 NOTE — H&P (Signed)
Regina Kidd is a 28 y.o. female G2P0010 at 39+ for IOL given h/o polyhydramnios and favorable cervix.  Pregnancy complicated by maternal obesity, Rh neg - received Rhogam 2/20.  Alsoreceived Tdap 2/20.  Had [place] to complete anatomy evaluation and for s>D with 80% growth,  Genetic screening WNL.    OB History    Gravida  1   Para      Term      Preterm      AB      Living        SAB      TAB      Ectopic      Multiple      Live Births            G1 SAB G2 present  No abn pap No STD  PMH: none PSH: none Family History: family history includes Cancer in her maternal grandmother; Diabetes in her maternal grandmother and paternal grandfather; Hypertension in her maternal grandmother, paternal grandfather, and paternal grandmother. Social History:  reports that she has never smoked. She has never used smokeless tobacco. She reports current alcohol use of about 1.0 standard drinks of alcohol per week. She reports that she does not use drugs.demand planner, married  Meds PNV, Rhogam All NKDA     Maternal Diabetes: No Genetic Screening: Normal Maternal Ultrasounds/Referrals: Normal Fetal Ultrasounds or other Referrals:  None Maternal Substance Abuse:  No Significant Maternal Medications:  None Significant Maternal Lab Results:  Lab values include: Group B Strep positive Other Comments:  None  Review of Systems  Constitutional: Negative.   HENT: Negative.   Eyes: Negative.   Respiratory: Negative.   Cardiovascular: Negative.   Gastrointestinal: Negative.   Genitourinary: Negative.   Musculoskeletal: Negative.   Skin: Negative.   Neurological: Negative.   Psychiatric/Behavioral: Negative.    Maternal Medical History:  Contractions: Frequency: irregular.   Perceived severity is moderate.    Fetal activity: Perceived fetal activity is normal.    Prenatal complications: H/o polyhydramnios  Prenatal Complications - Diabetes: none.      Height 5\' 5"   (1.651 m), last menstrual period 08/14/2017. Maternal Exam:  Uterine Assessment: Contraction frequency is irregular.   Abdomen: Patient reports no abdominal tenderness. Fundal height is appropriate for gestation .   Estimated fetal weight is 8-8.5#.   Fetal presentation: vertex  Introitus: Normal vulva. Normal vagina.    Physical Exam  Constitutional: She is oriented to person, place, and time. She appears well-developed and well-nourished.  HENT:  Head: Normocephalic and atraumatic.  Cardiovascular: Normal rate and regular rhythm.  Respiratory: Effort normal and breath sounds normal. No respiratory distress. She has no wheezes.  GI: Soft. Bowel sounds are normal. She exhibits no distension. There is no abdominal tenderness.  Genitourinary:    Vulva normal.   Musculoskeletal: Normal range of motion.  Neurological: She is alert and oriented to person, place, and time.  Skin: Skin is warm and dry.  Psychiatric: She has a normal mood and affect. Her behavior is normal.    Prenatal labs: ABO, Rh:   A neg Antibody:   neg Rubella:  immune RPR:   NR HBsAg:   neg HIV:   neg GBS:   pos  Hgb 12.6/Plt 257/Ur Cx neg/Chl neg/GC neg/Varicella immune  [place] nl nt, post plac glucola 11/essential panel neg [place] limited anat, post plac, female F/u completes anat F/u S>D 80% growth   Assessment/Plan: 27yo G2P0010 at 39+ for IOL given favorable, term  h/o poly GBBS + - PCN Pitocin and AROM after PCN for induciton Epidural and IV pain meds prn Expect SVD  Regina Kidd 06/27/2018, 11:58 AM

## 2018-06-27 NOTE — Progress Notes (Signed)
Patient ID: Regina Kidd, female   DOB: 06-15-1990, 28 y.o.   MRN: [id number]  H&P reviewed, no changes Reiterated 4 hr pitocin only/4hr after PCN until ROM  AFVSS gen NAD FHTs 140's, mod var, + accels, category 1 toco q 2-35min  Continue IOL, expect SVD

## 2018-06-27 NOTE — Anesthesia Preprocedure Evaluation (Signed)
Anesthesia Evaluation  Patient identified by MRN, date of birth, ID band Patient awake    Reviewed: Allergy & Precautions, NPO status , Patient's Chart, lab work & pertinent test results  Airway Mallampati: III  TM Distance: >3 FB Neck ROM: Full    Dental no notable dental hx. (+) Teeth Intact   Pulmonary neg pulmonary ROS,    Pulmonary exam normal breath sounds clear to auscultation       Cardiovascular Exercise Tolerance: Good Normal cardiovascular exam Rhythm:Regular Rate:Normal     Neuro/Psych negative neurological ROS     GI/Hepatic negative GI ROS, Neg liver ROS,   Endo/Other    Renal/GU      Musculoskeletal   Abdominal (+) + obese,   Peds  Hematology Hgb 10.7 Plt 243   Anesthesia Other Findings   Reproductive/Obstetrics (+) Pregnancy                             Anesthesia Physical Anesthesia Plan  ASA: III  Anesthesia Plan: Epidural   Post-op Pain Management:    Induction:   PONV Risk Score and Plan:   Airway Management Planned:   Additional Equipment:   Intra-op Plan:   Post-operative Plan:   Informed Consent: I have reviewed the patients History and Physical, chart, labs and discussed the procedure including the risks, benefits and alternatives for the proposed anesthesia with the patient or authorized representative who has indicated his/her understanding and acceptance.     Dental advisory given  Plan Discussed with:   Anesthesia Plan Comments: (G1P0 w inc BMI for LEA)        Anesthesia Quick Evaluation

## 2018-06-28 ENCOUNTER — Encounter (HOSPITAL_COMMUNITY): Payer: Self-pay | Admitting: Obstetrics

## 2018-06-28 LAB — RPR: RPR Ser Ql: NONREACTIVE

## 2018-06-28 MED ORDER — WITCH HAZEL-GLYCERIN EX PADS: 1.0000 "application " | MEDICATED_PAD | CUTANEOUS | Status: DC | PRN

## 2018-06-28 MED ORDER — SIMETHICONE 80 MG PO CHEW: 80.0000 mg | CHEWABLE_TABLET | ORAL | Status: DC | PRN

## 2018-06-28 MED ORDER — TETANUS-DIPHTH-ACELL PERTUSSIS 5-2.5-18.5 LF-MCG/0.5 IM SUSP: 0.5000 mL | Freq: Once | INTRAMUSCULAR | Status: DC

## 2018-06-28 MED ORDER — ACETAMINOPHEN 325 MG PO TABS: 650.0000 mg | ORAL_TABLET | ORAL | Status: DC | PRN

## 2018-06-28 MED ORDER — SENNOSIDES-DOCUSATE SODIUM 8.6-50 MG PO TABS
2.0000 | ORAL_TABLET | ORAL | Status: DC
  Administered 2018-06-29 (×2): 2 via ORAL
  Filled 2018-06-28 (×2): qty 2

## 2018-06-28 MED ORDER — GENTAMICIN SULFATE 40 MG/ML IJ SOLN
5.0000 mg/kg | INTRAVENOUS | Status: DC
  Administered 2018-06-28: 07:00:00 430 mg via INTRAVENOUS
  Filled 2018-06-28 (×2): qty 10.75

## 2018-06-28 MED ORDER — IBUPROFEN 600 MG PO TABS
600.0000 mg | ORAL_TABLET | Freq: Four times a day (QID) | ORAL | Status: DC
  Administered 2018-06-28 – 2018-06-30 (×8): 600 mg via ORAL
  Filled 2018-06-28 (×8): qty 1

## 2018-06-28 MED ORDER — ZOLPIDEM TARTRATE 5 MG PO TABS: 5.0000 mg | ORAL_TABLET | Freq: Every evening | ORAL | Status: DC | PRN

## 2018-06-28 MED ORDER — COCONUT OIL OIL: 1.0000 "application " | TOPICAL_OIL | Status: DC | PRN

## 2018-06-28 MED ORDER — DIBUCAINE (PERIANAL) 1 % EX OINT: 1.0000 "application " | TOPICAL_OINTMENT | CUTANEOUS | Status: DC | PRN

## 2018-06-28 MED ORDER — ONDANSETRON HCL 4 MG/2ML IJ SOLN: 4.0000 mg | INTRAMUSCULAR | Status: DC | PRN

## 2018-06-28 MED ORDER — DIPHENHYDRAMINE HCL 25 MG PO CAPS: 25.0000 mg | ORAL_CAPSULE | Freq: Four times a day (QID) | ORAL | Status: DC | PRN

## 2018-06-28 MED ORDER — BENZOCAINE-MENTHOL 20-0.5 % EX AERO
1.0000 "application " | INHALATION_SPRAY | CUTANEOUS | Status: DC | PRN
  Administered 2018-06-29: 1 via TOPICAL
  Filled 2018-06-28: qty 56

## 2018-06-28 MED ORDER — ONDANSETRON HCL 4 MG PO TABS: 4.0000 mg | ORAL_TABLET | ORAL | Status: DC | PRN

## 2018-06-28 MED ORDER — SODIUM CHLORIDE 0.9 % IV SOLN
2.0000 g | Freq: Four times a day (QID) | INTRAVENOUS | Status: DC
  Administered 2018-06-28 (×3): 2 g via INTRAVENOUS
  Filled 2018-06-28 (×5): qty 2000

## 2018-06-28 MED ORDER — PRENATAL MULTIVITAMIN CH
1.0000 | ORAL_TABLET | Freq: Every day | ORAL | Status: DC
  Administered 2018-06-30: 1 via ORAL
  Filled 2018-06-28: qty 1

## 2018-06-28 NOTE — Progress Notes (Signed)
Patient ID: Regina Kidd, female   DOB: 1990/03/27, 28 y.o.   MRN: [id number]   Comfortable w epidural  AF Tm = 99.6 VSS gen NAD FHTs 120- 130's, mod var, + accels, category 1 toco q  SVE 9.5/90-100/0 per RN  Will start Amp/Gent - due to likely Triple I Will likely start pushing soon, expect SVD

## 2018-06-28 NOTE — Progress Notes (Signed)
9 laps (1 fell in floor) 10 inst.  2 sharps

## 2018-06-28 NOTE — Progress Notes (Signed)
Patient ID: Regina Kidd, female   DOB: [birth year], 28 y.o.   MRN: [id number] Pt feeling comfortable  afeb VSS FHR with good variability baseline 120 Had a prolonged decel with one push so pitocin turned off and pt placed on left side with good recovery.  C/c/+2, some molding  Pt has progressed to complete, will allow baby to recover for a few minutes and then begin pushing.

## 2018-06-28 NOTE — Lactation Note (Signed)
This note was copied from a baby's chart. Lactation Consultation Note  Patient Name: Regina Kidd [id number] Date: 06/28/2018 Reason for consult: Initial assessment;1st time breastfeeding;Term P1, 9 hour female infant. Infant had two stools since delivery. Mom attempted to latch infant on right breast using the cross cradle hold and  after a few attempts, mom used a 20 mm NS  and infant was still reluctant to latch to breast at this time infant would only  hold nipple in his  mouth.  Mom hand expressed and infant took 5 ml by spoon and LC worked with suck training infant on gloved finger infant took only few sucks( uncoordinated) of  5 ml of  colostrum from the curve tip syringe. Infant was given total of 10 ml of colostrum by hand expression. Mom will continue to work towards latching infant to breast with 20 mm NS due to having flat nipples she will pre-pump with hand pump prior to applying nipple shield to latch infant to breast. Mom knows to breastfeed infant according hunger cues, 8 or more times within 24 hours. LC discussed I & O. Mom knows to ask Nurse or LC if she has questions, concerns of needs assistance with latching infant to breast. Reviewed Baby & Me book's Breastfeeding Basics.  Mom made aware of O/P services, breastfeeding support groups, community resources, and our phone # for post-discharge questions.   Maternal Data Formula Feeding for Exclusion: No Has patient been taught Hand Expression?: Yes(Infant given 5 ml of colostrum by spoon)  Feeding Feeding Type: Breast Fed  LATCH Score Latch: Too sleepy or reluctant, no latch achieved, no sucking elicited.  Audible Swallowing: None  Type of Nipple: Flat  Comfort (Breast/Nipple): Soft / non-tender  Hold (Positioning): Assistance needed to correctly position infant at breast and maintain latch.  LATCH Score: 4  Interventions Interventions: Breast feeding basics reviewed;Assisted with latch;Skin to skin;Breast  massage;Hand express;Pre-pump if needed;Position options;Expressed milk;Shells;Hand pump;DEBP;Adjust position;Support pillows  Lactation Tools Discussed/Used Tools: Shells;Pump;43F feeding tube / Syringe;Nipple Shields Nipple shield size: 20(flat nipples ) Shell Type: Other (comment)(Flat) Breast pump type: Double-Electric Breast Pump Pump Review: Setup, frequency, and cleaning;Milk Storage Initiated by:: Danelle Earthly, IBCLC Date initiated:: 06/28/18   Consult Status Consult Status: Follow-up Date: 06/29/18 Follow-up type: In-patient    Danelle Earthly 06/28/2018, 9:55 PM

## 2018-06-28 NOTE — Progress Notes (Signed)
Patient ID: Regina Kidd, female   DOB: 1990/12/08, 28 y.o.   MRN: [id number] Pt comfortable with epidural, lying in right exaggerated Sims  Temp 99.6  VSS  C/9/0 Some caput  FHR with good variability and scalp stim, baseline a bit lower at times to 110 FSE applied to follow FHR closely D/w pt and husband protracted active phase with cervix 9 cm for 5 hours.  IUPC placed and contractions maybe not adequate so will increase pitocin.   Received Amp/gent for low grade temp, protracted labor  Reviewed with patient and husband that if no further progress in next 2 hours I will recommend we proceed with c-section.  Reviewed c-section with patient and husband in detail with risks of bleeding, infection and possible damage to bowel and bladder.  Pt and husband agreeable to plan.

## 2018-06-28 NOTE — Progress Notes (Signed)
Pharmacy Antibiotic Note  Regina Kidd is a 28 y.o. female admitted on 06/27/2018 for IOL.  Patient now with maternal fever, suspected triple I.   Pharmacy has been consulted for gentamicin dosing.  Plan: gentamicin 5mg /kg (ABW) Q24 hours  Height: 5\' 5"  (165.1 cm) Weight: 286 lb (129.7 kg) IBW/kg (Calculated) : 57  Temp (24hrs), Avg:98.7 F (37.1 C), Min:98 F (36.7 C), Max:99.6 F (37.6 C)  Recent Labs  Lab 06/27/18 1218  WBC 12.9*    CrCl cannot be calculated (Patient's most recent lab result is older than the maximum 21 days allowed.).    No Known Allergies  Antimicrobials this admission: Ampicillin 2g Q6 4/24 >>  PCN 4/23 >>    Thank you for allowing pharmacy to be a part of this patient's care.  Loyola Mast 06/28/2018 6:29 AM

## 2018-06-29 LAB — CBC
HCT: 26.1 % — ABNORMAL LOW (ref 36.0–46.0)
Hemoglobin: 8.3 g/dL — ABNORMAL LOW (ref 12.0–15.0)
MCH: 26.5 pg (ref 26.0–34.0)
MCHC: 31.8 g/dL (ref 30.0–36.0)
MCV: 83.4 fL (ref 80.0–100.0)
Platelets: 152 10*3/uL (ref 150–400)
RBC: 3.13 MIL/uL — ABNORMAL LOW (ref 3.87–5.11)
RDW: 15.9 % — ABNORMAL HIGH (ref 11.5–15.5)
WBC: 12.5 10*3/uL — ABNORMAL HIGH (ref 4.0–10.5)
nRBC: 0 % (ref 0.0–0.2)

## 2018-06-29 MED ORDER — RHO D IMMUNE GLOBULIN 1500 UNIT/2ML IJ SOSY
300.0000 ug | PREFILLED_SYRINGE | Freq: Once | INTRAMUSCULAR | Status: AC
  Administered 2018-06-29: 300 ug via INTRAVENOUS
  Filled 2018-06-29: qty 2

## 2018-06-29 NOTE — Progress Notes (Signed)
Post Partum Day 1 Subjective: no complaints, up ad lib and tolerating PO  Working on breastfeeding.  Not sure if wants early d/c  Objective: Blood pressure 115/60, pulse 97, temperature 98 F (36.7 C), temperature source Oral, resp. rate 18, height 5\' 5"  (1.651 m), weight 129.7 kg, last menstrual period 08/14/2017, SpO2 98 %, unknown if currently breastfeeding.  Physical Exam:  General: alert and cooperative Lochia: appropriate Uterine Fundus: firm  Recent Labs    06/27/18 1218 06/29/18 0808  HGB 10.7* 8.3*  HCT 33.9* 26.1*    Assessment/Plan: Plan for discharge tomorrow for now, may change mind when meets with lactation depending how that goes   LOS: 2 days   Regina Kidd 06/29/2018, 9:20 AM

## 2018-06-29 NOTE — Lactation Note (Signed)
This note was copied from a baby's chart. Lactation Consultation Note:  Arrived in room and staff nurse attempting to assist mother with latch. Infant was given 2 ml with a curved tip syringe.   Teaching with parents on proper latch and firming mothers nipple. Mothers nipples are semi-flat. She is able to hand express large drops.  Infant placed in football hold, attempt to latch infant on the bare breast. Infant took a few good strong sucks. Mother reports having not felt that before.  Mother fit with a #24 NS. Infant opened wide and suckled a few times but no sustained latch.  Encouraged mother to firm nipple and offer bare breast before shield.  Lots of teaching with parents. Advised them to do frequent STS with Sherilyn Cooter and discussed the second night of life . Parents were given supplemental guidelines and suggested small amts of formula to pre-fill the nipple shield and maybe finger feed.  Parents were receptive and will call if formula needed.   Mother is able to hand express and is post pumping every 2-3 hours.  Mother to call for staff assistance with feedings.   Patient Name: Boy Chosen Wada [id number] Date: 06/29/2018 Reason for consult: Follow-up assessment   Maternal Data    Feeding Feeding Type: Breast Fed  LATCH Score                   Interventions Interventions: Breast feeding basics reviewed;Assisted with latch;Skin to skin;Hand express;Adjust position;Support pillows;Position options;Expressed milk;Hand pump;DEBP  Lactation Tools Discussed/Used     Consult Status Consult Status: Follow-up Date: 06/30/18 Follow-up type: In-patient    Stevan Born [facility] 06/29/2018, 3:33 PM

## 2018-06-29 NOTE — Anesthesia Postprocedure Evaluation (Signed)
Anesthesia Post Note  Patient: Regina Kidd  Procedure(s) Performed: AN AD HOC LABOR EPIDURAL     Patient location during evaluation: Mother Baby Anesthesia Type: Epidural Level of consciousness: awake and alert Pain management: pain level controlled Vital Signs Assessment: post-procedure vital signs reviewed and stable Respiratory status: spontaneous breathing, nonlabored ventilation and respiratory function stable Cardiovascular status: stable Postop Assessment: no headache, no backache and able to ambulate Anesthetic complications: no    Last Vitals:  Vitals:   06/29/18 0030 06/29/18 0415  BP: 119/70 115/60  Pulse: (!) 105 97  Resp:  18  Temp: 36.8 C 36.7 C  SpO2:      Last Pain:  Vitals:   06/29/18 0516  TempSrc:   PainSc: 3    Pain Goal: Patients Stated Pain Goal: 8 (06/27/18 1622)                 Sherrie George [facility]

## 2018-06-30 MED ORDER — ACETAMINOPHEN 325 MG PO TABS: 650.0000 mg | ORAL_TABLET | ORAL | 0 refills | Status: DC | PRN

## 2018-06-30 MED ORDER — IBUPROFEN 600 MG PO TABS: 600.0000 mg | ORAL_TABLET | Freq: Four times a day (QID) | ORAL | 0 refills | Status: DC

## 2018-06-30 NOTE — Progress Notes (Signed)
Post Partum Day 2 Subjective: no complaints, tolerating PO and + flatus  Objective: Blood pressure 117/73, pulse 83, temperature 98.3 F (36.8 C), temperature source Oral, resp. rate 18, height 5\' 5"  (1.651 m), weight 129.7 kg, last menstrual period 08/14/2017, SpO2 96 %, unknown if currently breastfeeding.  Physical Exam:  General: alert and cooperative Lochia: appropriate Uterine Fundus: firm   Recent Labs    06/27/18 1218 06/29/18 0808  HGB 10.7* 8.3*  HCT 33.9* 26.1*    Assessment/Plan: Discharge home   LOS: 3 days   Oliver Pila 06/30/2018, 7:28 AM

## 2018-06-30 NOTE — Discharge Summary (Signed)
OB Discharge Summary     Patient Name: Regina Kidd DOB: 1990-11-02 MRN: [id number]  Date of admission: 06/27/2018 Delivering MD: Huel Cote   Date of discharge: 06/30/2018  Admitting diagnosis: INDUCTION Intrauterine pregnancy: [redacted]w[redacted]d     Secondary diagnosis:  Active Problems:   Normal pregnancy, third trimester   NSVD (normal spontaneous vaginal delivery)  Additional problems: intrapartum fever that resolved post delivery     Discharge diagnosis: Term Pregnancy Delivered                                                                                                Post partum procedures:none  Augmentation: AROM and Pitocin  Complications: None  Hospital course:  Induction of Labor With Vaginal Delivery   28 y.o. yo G1P1001 at [redacted]w[redacted]d was admitted to the hospital 06/27/2018 for induction of labor.  Indication for induction: Favorable cervix at term.  Patient had a labor course as follows: Membrane Rupture Time/Date: 5:12 PM ,06/27/2018  She had a protracted active phase with relative arrest of labor at 9cm for several hours likely due to baby's OP presentation. Intrapartum Procedures: Episiotomy: None [1]                                         Lacerations:  Sulcus [9]  Patient had delivery of a Viable infant.  Information for the patient's newborn:  Truth, Elfman [[id number]]  Delivery Method: Vag-Spont   06/28/2018  Details of delivery can be found in separate delivery note.  Patient had a routine postpartum course. Patient is discharged home 06/30/18.  Physical exam  Vitals:   06/29/18 0415 06/29/18 1728 06/29/18 2140 06/30/18 0520  BP: 115/60 132/72 122/80 117/73  Pulse: 97 96 84 83  Resp: 18 18    Temp: 98 F (36.7 C) 97.9 F (36.6 C) 97.8 F (36.6 C) 98.3 F (36.8 C)  TempSrc: Oral Oral Oral Oral  SpO2:  98%  96%  Weight:      Height:       General: alert and cooperative Lochia: appropriate Uterine Fundus: firm  Labs: Lab Results   Component Value Date   WBC 12.5 (H) 06/29/2018   HGB 8.3 (L) 06/29/2018   HCT 26.1 (L) 06/29/2018   MCV 83.4 06/29/2018   PLT 152 06/29/2018   CMP Latest Ref Rng & Units 01/08/2017  Glucose 70 - 99 mg/dL 85  BUN 6 - 23 mg/dL 12  Creatinine 3.38 - 3.29 mg/dL 1.91  Sodium 660 - 600 mEq/L 138  Potassium 3.5 - 5.1 mEq/L 3.8  Chloride 96 - 112 mEq/L 103  CO2 19 - 32 mEq/L 26  Calcium 8.4 - 10.5 mg/dL 9.5  Total Protein 6.0 - 8.3 g/dL 6.8  Total Bilirubin 0.2 - 1.2 mg/dL 0.6  Alkaline Phos 39 - 117 U/L 88  AST 0 - 37 U/L 16  ALT 0 - 35 U/L 13    Discharge instruction: per After Visit Summary and "Baby and Me Booklet".  After visit  meds:  Allergies as of 06/30/2018   No Known Allergies     Medication List    TAKE these medications   acetaminophen 325 MG tablet Commonly known as:  Tylenol Take 2 tablets (650 mg total) by mouth every 4 (four) hours as needed (for pain scale < 4).   ibuprofen 600 MG tablet Commonly known as:  ADVIL Take 1 tablet (600 mg total) by mouth every 6 (six) hours.   multivitamin-prenatal 27-0.8 MG Tabs tablet Take 1 tablet by mouth daily at 12 noon.       Diet: routine diet  Activity: Advance as tolerated. Pelvic rest for 6 weeks.   Outpatient follow up:6 weeks Follow up Appt:No future appointments. Follow up Visit:No follow-ups on file.  Postpartum contraception: Undecided  Newborn Data: Live born female  Birth Weight: 8 lb 3.8 oz (3735 g) APGAR: 8, 8  Newborn Delivery   Birth date/time:  06/28/2018 12:43:00 Delivery type:  Vaginal, Spontaneous     Baby Feeding: Breast Disposition:home with mother   06/30/2018 Oliver PilaKathy W Brode Sculley, MD

## 2018-06-30 NOTE — Progress Notes (Signed)
CSW received and acknowledges consult for EDPS of 9.  Consult screened out due to 9 on EDPS does not warrant a CSW consult.  MOB whom scores are greater than 9/yes to question 10 on Edinburgh Postpartum Depression Screen warrants a CSW consult.   Please contact the Clinical Social Worker if needs arise or by MOB request.  Devone Bonilla Irwin, LCSWA  [facility] [phone]  

## 2018-06-30 NOTE — Lactation Note (Signed)
This note was copied from a baby's chart. Lactation Consultation Note  Patient Name: Regina Kidd [id number] Date: 06/30/2018 Reason for consult: Follow-up assessment   P1, Baby 45 hours old and baby on phototherapy. Mother states baby is often either too sleepy to breastfeed for will only latch for a few minutes.  Discussed how jaundice can make babies sleepy. Mother breastfeeding when able and if not baby is finger syringe feeding formula and colostrum. Mother is pumping q 3 hours.  Encouraged hand expression and hands on pumping. Discussed the option of starting slow flow nipple once volume increases but encouraged unwrapping baby and waking prior to latch attempt. Feed on demand approximately 8-12 times per day.   Reviewed engorgement care and monitoring voids/stools.    Maternal Data    Feeding    LATCH Score                   Interventions Interventions: Breast feeding basics reviewed;DEBP  Lactation Tools Discussed/Used     Consult Status Consult Status: Follow-up Date: 07/01/18 Follow-up type: In-patient    Dahlia Byes [facility] 06/30/2018, 10:38 AM

## 2018-07-02 LAB — RH IG WORKUP (INCLUDES ABO/RH)
ABO/RH(D): A NEG
Antibody Screen: NEGATIVE
Fetal Screen: NEGATIVE
Gestational Age(Wks): 39.5
Unit division: 0

## 2018-08-12 DIAGNOSIS — Z3009 Encounter for other general counseling and advice on contraception: Secondary | ICD-10-CM | POA: Diagnosis not present

## 2018-08-12 DIAGNOSIS — Z1389 Encounter for screening for other disorder: Secondary | ICD-10-CM | POA: Diagnosis not present

## 2019-04-25 DIAGNOSIS — Z20828 Contact with and (suspected) exposure to other viral communicable diseases: Secondary | ICD-10-CM | POA: Diagnosis not present

## 2019-06-29 IMAGING — US US OB < 14 WEEKS - US OB TV
1 series · 14 of 28 positions shown · non-contrast
Comparison: None.

CLINICAL DATA: Vaginal bleeding and cramping for 3 days.
Gestational age by LMP of 5 weeks 1 day.

EXAM:
OBSTETRIC <14 WK US AND TRANSVAGINAL OB US
TECHNIQUE: Both transabdominal and transvaginal ultrasound examinations were
performed for complete evaluation of the gestation as well as the
maternal uterus, adnexal regions, and pelvic cul-de-sac.
Transvaginal technique was performed to assess early pregnancy.

[Series 1: us ob < 14 weeks - us ob tv · 0.25mm/px · 14 of 100 slices shown]
[im 4/100]
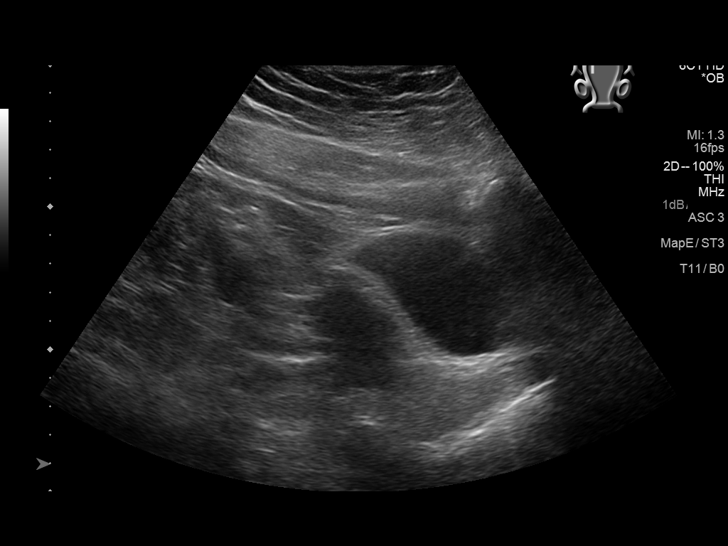
[im 12/100]
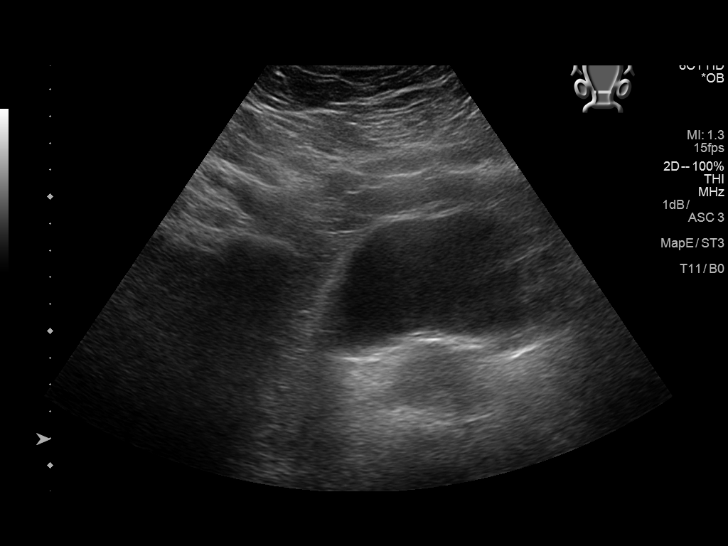
[im 19/100]
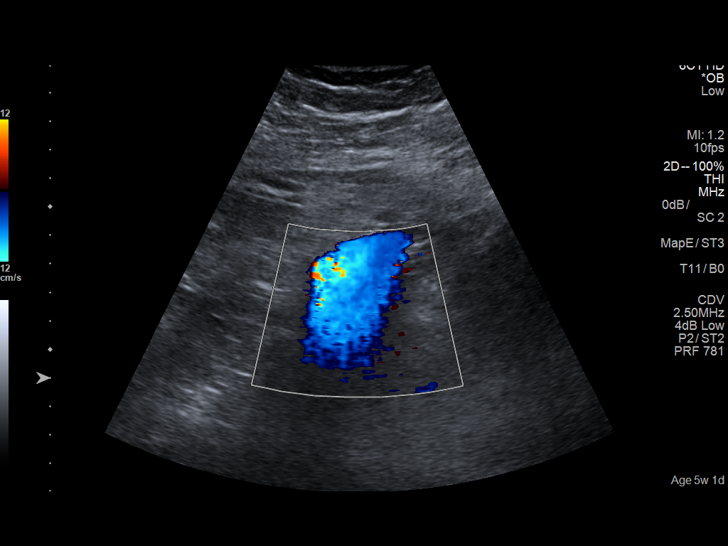
[im 26/100]
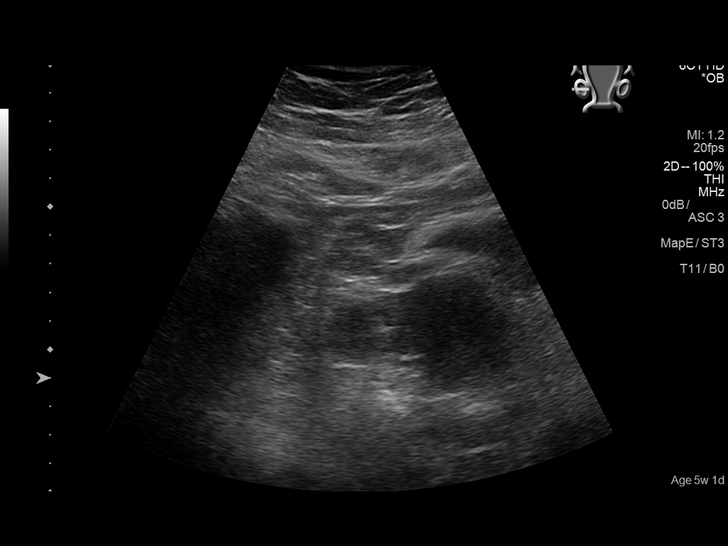
[im 34/100]
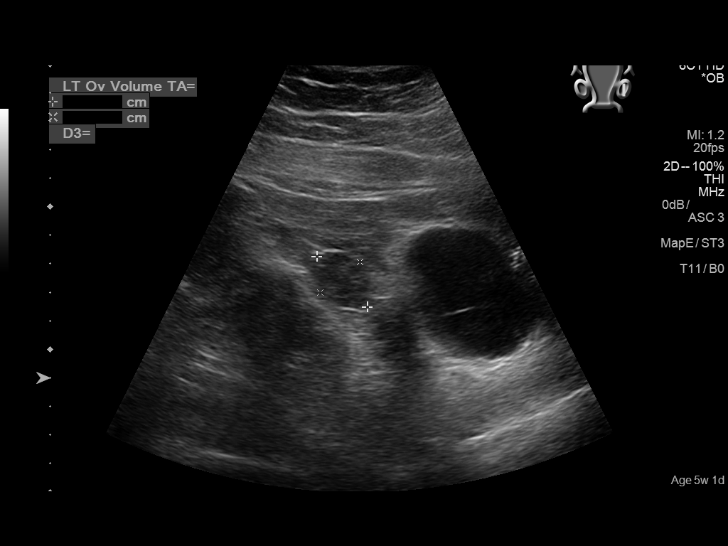
[im 41/100]
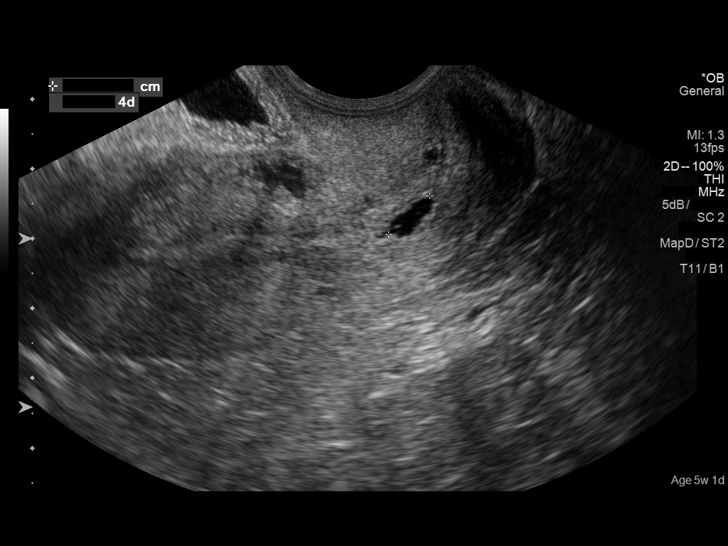
[im 48/100]
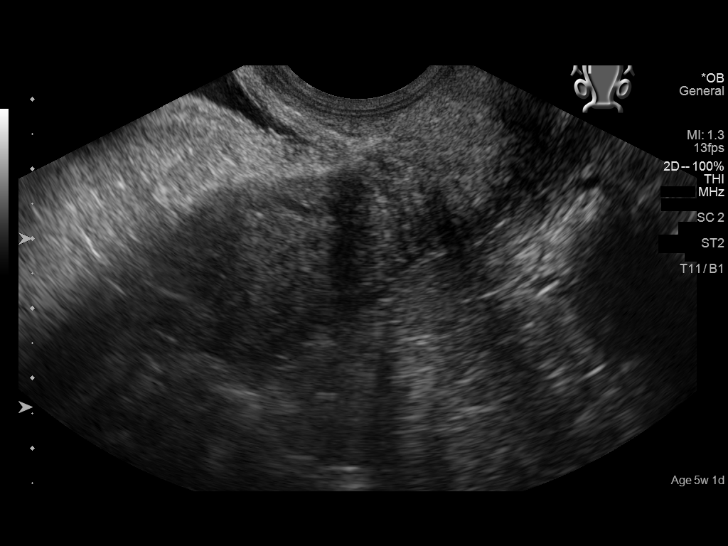
[im 56/100]
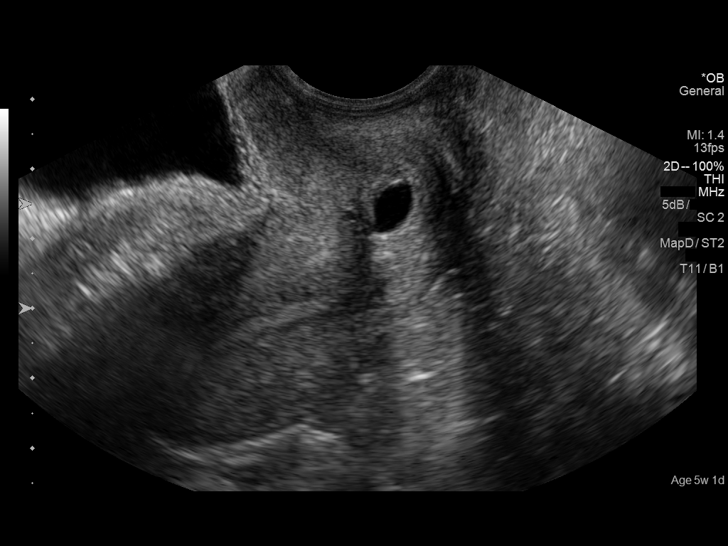
[im 63/100]
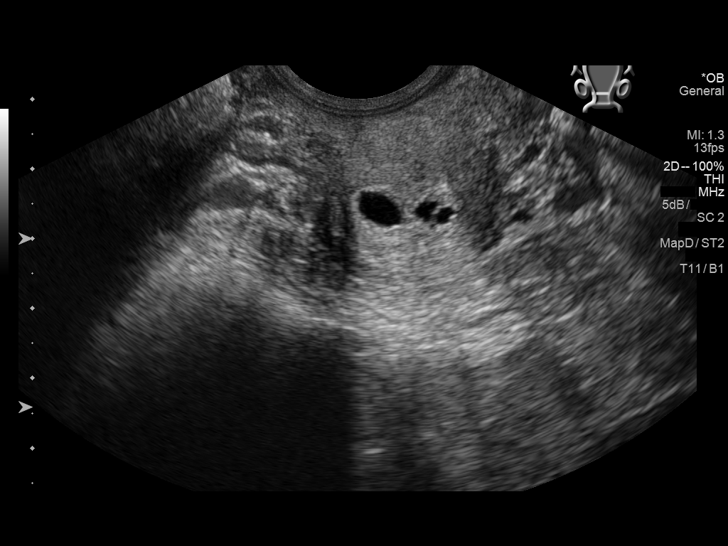
[im 70/100]
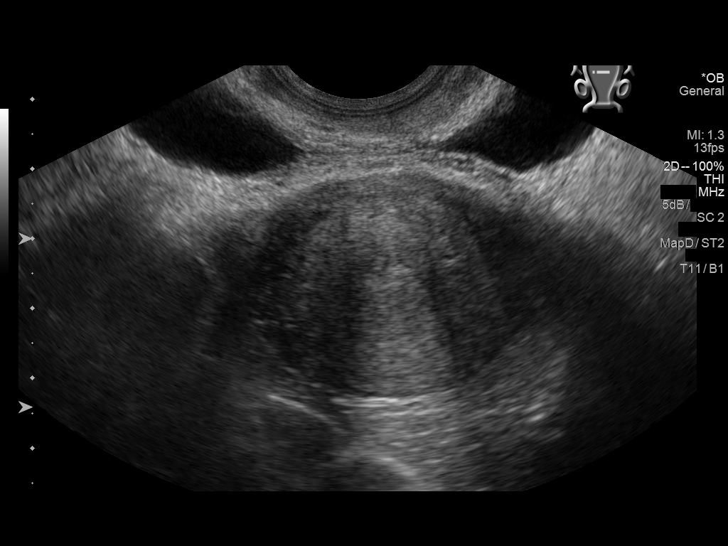
[im 78/100]
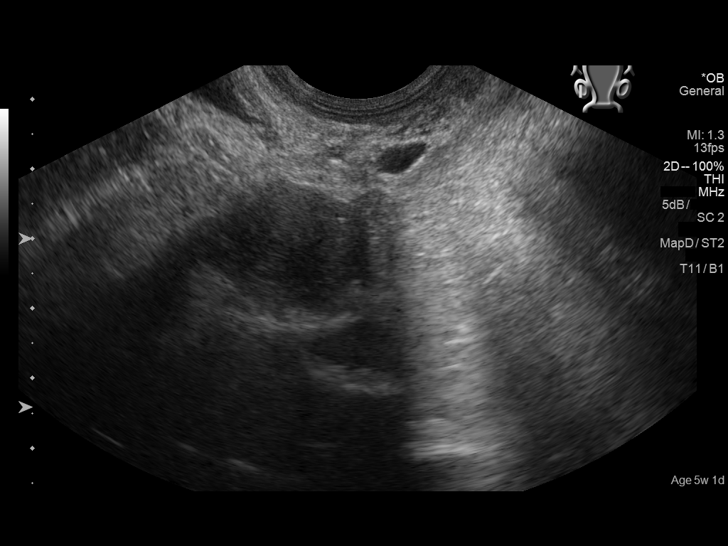
[im 85/100]
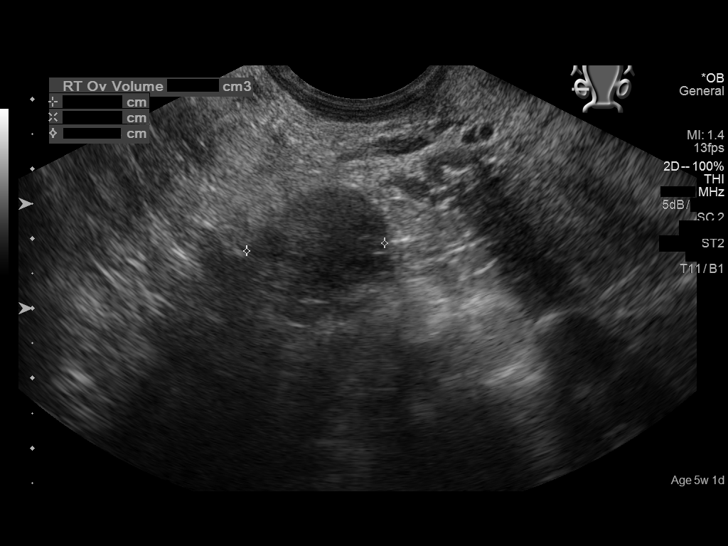
[im 92/100]
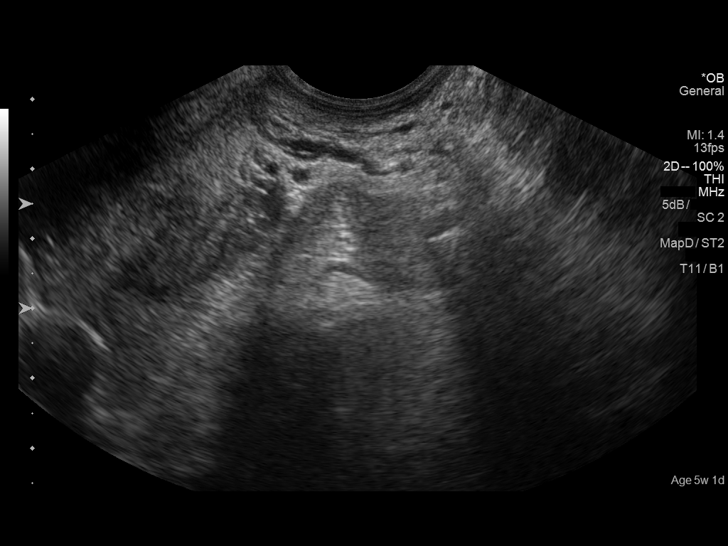
[im 100/100]
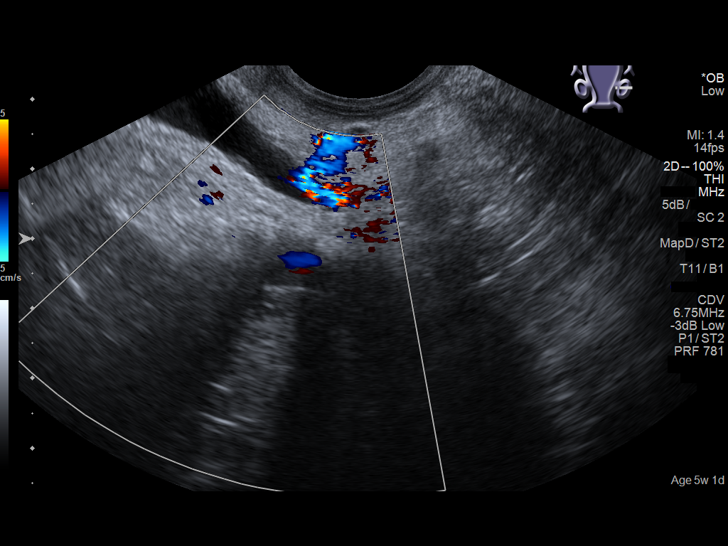

[14 of 28 positions shown; findings below may reference images not displayed]

FINDINGS: Intrauterine gestational sac: None definitely visualized

Maternal uterus/adnexae: Thin endometrium measures 3 mm. Probable
small cervical nabothian cysts noted. Both ovaries are normal in
appearance. No adnexal mass or abnormal free fluid identified.
IMPRESSION: Pregnancy of unknown anatomic location (no intrauterine gestational
sac or adnexal mass identified). Differential diagnosis includes
recent spontaneous abortion, IUP too early to visualize, and
non-visualized ectopic pregnancy. Recommend correlation with serial
beta-hCG levels, and follow up US if warranted clinically.

## 2019-09-16 DIAGNOSIS — J018 Other acute sinusitis: Secondary | ICD-10-CM | POA: Diagnosis not present

## 2019-11-19 ENCOUNTER — Encounter: Payer: Self-pay | Admitting: Family Medicine

## 2019-11-19 ENCOUNTER — Other Ambulatory Visit (HOSPITAL_COMMUNITY)
Admission: RE | Admit: 2019-11-19 | Discharge: 2019-11-19 | Disposition: A | Discharge status: Home / Self Care | Payer: BC Managed Care – PPO | Source: Ambulatory Visit | Attending: Family Medicine | Admitting: Family Medicine

## 2019-11-19 ENCOUNTER — Ambulatory Visit (INDEPENDENT_AMBULATORY_CARE_PROVIDER_SITE_OTHER): Payer: BC Managed Care – PPO | Admitting: Family Medicine

## 2019-11-19 ENCOUNTER — Other Ambulatory Visit: Payer: Self-pay

## 2019-11-19 VITALS — BP 122/78 | HR 79 | Temp 98.1°F | Ht 65.0 in | Wt 282.8 lb

## 2019-11-19 DIAGNOSIS — Z0001 Encounter for general adult medical examination with abnormal findings: Secondary | ICD-10-CM | POA: Diagnosis not present

## 2019-11-19 DIAGNOSIS — Z124 Encounter for screening for malignant neoplasm of cervix: Secondary | ICD-10-CM | POA: Insufficient documentation

## 2019-11-19 DIAGNOSIS — Z833 Family history of diabetes mellitus: Secondary | ICD-10-CM

## 2019-11-19 LAB — LIPID PANEL
Cholesterol: 189 mg/dL (ref 0–200)
HDL: 38.5 mg/dL — ABNORMAL LOW (ref >39.00)
LDL Cholesterol: 124 mg/dL — ABNORMAL HIGH (ref 0–99)
NonHDL: 150.43
Total CHOL/HDL Ratio: 5
Triglycerides: 134 mg/dL (ref 0.0–149.0)
VLDL: 26.8 mg/dL (ref 0.0–40.0)

## 2019-11-19 LAB — COMPREHENSIVE METABOLIC PANEL
ALT: 24 U/L (ref 0–35)
AST: 17 U/L (ref 0–37)
Albumin: 4 g/dL (ref 3.5–5.2)
Alkaline Phosphatase: 84 U/L (ref 39–117)
BUN: 14 mg/dL (ref 6–23)
CO2: 27 mEq/L (ref 19–32)
Calcium: 8.9 mg/dL (ref 8.4–10.5)
Chloride: 105 mEq/L (ref 96–112)
Creatinine, Ser: 0.81 mg/dL (ref 0.40–1.20)
GFR: 83.09 mL/min (ref >60.00)
Glucose, Bld: 84 mg/dL (ref 70–99)
Potassium: 4.1 mEq/L (ref 3.5–5.1)
Sodium: 139 mEq/L (ref 135–145)
Total Bilirubin: 0.5 mg/dL (ref 0.2–1.2)
Total Protein: 6.7 g/dL (ref 6.0–8.3)

## 2019-11-19 LAB — TSH: TSH: 2.91 u[IU]/mL (ref 0.35–4.50)

## 2019-11-19 LAB — HEMOGLOBIN A1C: Hgb A1c MFr Bld: 5.6 % (ref 4.6–6.5)

## 2019-11-19 NOTE — Progress Notes (Signed)
Annual Exam   Chief Complaint:  Chief Complaint  Patient presents with  . Establish Care  . Annual Exam    History of Present Illness:  Regina Kidd is a 29 y.o. G1P1001 who LMP was Patient's last menstrual period was 11/14/2019 (exact date)., presents today for her annual examination.      Nutrition/Lifestyle Exercise: tries to walk, play with son Diet: oatmeal/cheerios for breakfast, leftovers for lunch or fastfood or freezer food, home cooked 50/50  She does get adequate calcium and Vitamin D in her diet.  Social History   Tobacco Use  Smoking Status Never Smoker  Smokeless Tobacco Never Used   Social History   Substance and Sexual Activity  Alcohol Use Yes  . Alcohol/week: 1.0 standard drink  . Types: 1 Cans of beer per week   Comment: rare use   Social History   Substance and Sexual Activity  Drug Use No     Safety The patient wears seatbelts: yes.     The patient feels safe at home and in their relationships: yes.  General Health Dentist in the last year: Yes Eye doctor: not applicable  Menstrual Irregular initially after having her baby Now more regular periods  GYN She is single partner, contraception - none.     Cervical Cancer Screening (Age 13-65) Last Pap:  November 2018 Results were: atypical squamous cellularity of undetermined significance (ASCUS) with HPV negative  Family History of Breast Cancer: MGM Family History of Ovarian Cancer: no    Weight Wt Readings from Last 3 Encounters:  11/19/19 282 lb 12 oz (128.3 kg)  06/27/18 286 lb (129.7 kg)  09/19/17 233 lb 12.8 oz (106.1 kg)   Patient has very high BMI  BMI Readings from Last 1 Encounters:  11/19/19 47.05 kg/m     Chronic disease screening Blood pressure monitoring:  BP Readings from Last 3 Encounters:  11/19/19 122/78  06/30/18 117/73  09/19/17 112/64     Lipid Monitoring: Indication for screening: age 57>40, obesity, diabetes, family hx, CV risk  factors.  Lipid screening: Yes  Lab Results  Component Value Date   CHOL 216 (H) 01/08/2017   HDL 53.60 01/08/2017   LDLCALC 148 (H) 01/08/2017   TRIG 70.0 01/08/2017   CHOLHDL 4 01/08/2017     Diabetes Screening: age 53>40, overweight, family hx, PCOS, hx of gestational diabetes, at risk ethnicity, elevated blood pressure >135/80.  Diabetes Screening screening: Yes  No results found for: HGBA1C    No past medical history on file.  No past surgical history on file.  Prior to Admission medications   Medication Sig Start Date End Date Taking? Authorizing Provider  cetirizine (ZYRTEC) 10 MG tablet Take 10 mg by mouth daily.   Yes [provider]    No Known Allergies  Gynecologic History: Patient's last menstrual period was 11/14/2019 (exact date).  Obstetric History: G1P1001  Social History   Socioeconomic History  . Marital status: Married    Spouse name: Vilma PraderHeath  . Number of children: 1  . Years of education: college  . Highest education level: Not on file  Occupational History  . Not on file  Tobacco Use  . Smoking status: Never Smoker  . Smokeless tobacco: Never Used  Substance and Sexual Activity  . Alcohol use: Yes    Alcohol/week: 1.0 standard drink    Types: 1 Cans of beer per week    Comment: rare use  . Drug use: No  . Sexual activity: Yes  Partners: Male    Birth control/protection: None  Other Topics Concern  . 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, walk with son, play outside with son      Exercise: tries to walk, play with son   Diet: oatmeal/cheerios for breakfast, leftovers for lunch or fastfood or freezer food, home cooked 50/50      Safety   Seat belts: Yes    Guns: Yes  and secure   Safe in  relationships: Yes    Social Determinants of Health   Financial Resource Strain:   . Difficulty of Paying Living Expenses: Not on file  Food Insecurity:   . Worried About Programme researcher, broadcasting/film/video in the Last Year: Not on file  . Ran Out of Food in the Last Year: Not on file  Transportation Needs:   . Lack of Transportation (Medical): Not on file  . Lack of Transportation (Non-Medical): Not on file  Physical Activity:   . Days of Exercise per Week: Not on file  . Minutes of Exercise per Session: Not on file  Stress:   . Feeling of Stress : Not on file  Social Connections:   . Frequency of Communication with Friends and Family: Not on file  . Frequency of Social Gatherings with Friends and Family: Not on file  . Attends Religious Services: Not on file  . Active Member of Clubs or Organizations: Not on file  . Attends Banker Meetings: Not on file  . Marital Status: Not on file  Intimate Partner Violence:   . Fear of Current or Ex-Partner: Not on file  . Emotionally Abused: Not on file  . Physically Abused: Not on file  . Sexually Abused: Not on file    Family History  Problem Relation Age of Onset  . Hypertension Maternal Grandmother   . Diabetes Maternal Grandmother   . Breast cancer Maternal Grandmother   . Hypertension Paternal Grandmother   . Hypertension Paternal Grandfather   . Diabetes Paternal Grandfather   . Thyroid disease Mother   . Hypertension Father     Review of Systems  Constitutional: Negative for chills and fever.  HENT: Negative for congestion and sore throat.   Eyes: Negative for blurred vision and double vision.  Respiratory: Negative for shortness of breath.   Cardiovascular: Negative for chest pain.  Gastrointestinal: Negative for heartburn, nausea and vomiting.  Genitourinary: Negative.   Musculoskeletal: Negative.  Negative for myalgias.  Skin: Negative for rash.  Neurological: Negative for dizziness and headaches.   Endo/Heme/Allergies: Does not bruise/bleed easily.  Psychiatric/Behavioral: Negative for depression. The patient is not nervous/anxious.      Physical Exam BP 122/78   Pulse 79   Temp 98.1 F (36.7 C) (Temporal)   Ht 5\' 5"  (1.651 m)   Wt 282 lb 12 oz (128.3 kg)   LMP 11/14/2019 (Exact Date)   SpO2 97%   Breastfeeding No   BMI 47.05 kg/m    BP Readings from Last 3 Encounters:  11/19/19 122/78  06/30/18 117/73  09/19/17 112/64    Wt Readings from Last 3 Encounters:  11/19/19 282 lb 12 oz (128.3 kg)  06/27/18 286 lb (129.7 kg)  09/19/17 233  lb 12.8 oz (106.1 kg)     Physical Exam Exam conducted with a chaperone present.  Constitutional:      General: She is not in acute distress.    Appearance: She is well-developed. She is not diaphoretic.  HENT:     Head: Normocephalic and atraumatic.     Right Ear: External ear normal.     Left Ear: External ear normal.     Nose: Nose normal.  Eyes:     General: No scleral icterus.    Conjunctiva/sclera: Conjunctivae normal.  Cardiovascular:     Rate and Rhythm: Normal rate and regular rhythm.     Heart sounds: No murmur heard.   Pulmonary:     Effort: Pulmonary effort is normal. No respiratory distress.     Breath sounds: Normal breath sounds. No wheezing.  Abdominal:     General: Bowel sounds are normal. There is no distension.     Palpations: Abdomen is soft. There is no mass.     Tenderness: There is no abdominal tenderness. There is no guarding or rebound.  Genitourinary:    Cervix: Dilated. Cervical bleeding (scant blood at os with collection) present.  Musculoskeletal:        General: Normal range of motion.     Cervical back: Neck supple.  Lymphadenopathy:     Cervical: No cervical adenopathy.  Skin:    General: Skin is warm and dry.     Capillary Refill: Capillary refill takes less than 2 seconds.  Neurological:     Mental Status: She is alert and oriented to person, place, and time.     Deep Tendon  Reflexes: Reflexes normal.  Psychiatric:        Behavior: Behavior normal.       Results: Depression screen [facility] 2/9 11/19/2019 01/08/2017 10/27/2014  Decreased Interest 1 0 0  Down, Depressed, Hopeless 0 0 0  PHQ - 2 Score 1 0 0  Altered sleeping 0 - -  Tired, decreased energy 1 - -  Change in appetite 1 - -  Feeling bad or failure about yourself  0 - -  Trouble concentrating 0 - -  Moving slowly or fidgety/restless 0 - -  Suicidal thoughts 0 - -  PHQ-9 Score 3 - -      Assessment: 29 y.o. G22P1001 female here for routine annual examination.  Plan: Problem List Items Addressed This Visit      Other   Morbid obesity (HCC)   Relevant Orders   Comprehensive metabolic panel   TSH   Lipid panel   Hemoglobin A1c    Other Visit Diagnoses    Encounter for general adult medical examination with abnormal findings    -  Primary   Family history of diabetes mellitus       Relevant Orders   Hemoglobin A1c   Cervical cancer screening       Relevant Orders   Cytology - PAP       Screening: -- Blood pressure screen normal -- cholesterol screening: will obtain -- Weight screening: obese: discussed management options, including lifestyle, dietary, and exercise. -- Diabetes Screening: will obtain -- Nutrition: encouraged healthy diet   Psych -- Depression screening (PHQ-9):    Office Visit from 11/19/2019 in [facility] Total Score 3       Safety -- tobacco screening: not using -- alcohol screening:  low-risk usage. -- no evidence of domestic violence or intimate partner violence.  Cancer Screening --  pap smear collected per ASCCP guidelines -- family history of breast cancer screening: done. not at high risk.   Immunizations Immunization History  Administered Date(s) Administered  . Moderna SARS-COVID-2 Vaccination 11/03/2019  . Tdap 01/08/2017    -- flu vaccine declined -- TDAP q10 years up to date -- Covid-19 Vaccine up to  date  Encouraged regular vision and dental screening. Encouraged healthy exercise and diet.   Lynnda Child

## 2019-11-19 NOTE — Patient Instructions (Signed)
Here is what I would recommend:  1) Increase the amount of water you drink a day > specifically drink a glass of water 8 oz or more before every meal 2) Could start a fiber supplement (like metamucil) > You could take this up to 3 times a day, but I would start with 1 time a day until you get used to it. It may cause some stomach upset 3) Make sure you sit down to eat and eat slowly (cut meat one piece at a time) > specifically train your body to eat only at the table (avoiding snacking in front of the TV) 4) Fill up on healthy items first > consider eating a salad with low calorie salad dressing before every meal  5) Make 1/2 of your plate vegetables 6) Keep healthy snacks available for those times when you are bored 7) Consider using a calorie counting app like MyFitnessPal > counting calories helps you make wise choices around snacking. Or if you can afford it you could sign up for Weight Watchers -- and learn about healthy options through a point system   

## 2019-11-21 ENCOUNTER — Telehealth: Payer: Self-pay | Admitting: Family Medicine

## 2019-11-21 LAB — CYTOLOGY - PAP
Comment: NEGATIVE
Diagnosis: NEGATIVE
Diagnosis: REACTIVE
High risk HPV: NEGATIVE

## 2019-11-21 NOTE — Telephone Encounter (Signed)
Patient returned your call, [phone]

## 2019-11-24 NOTE — Telephone Encounter (Signed)
Spoke to pt and relayed her lab results, per Dr. Selena Batten.

## 2019-12-17 ENCOUNTER — Encounter: Payer: BLUE CROSS/BLUE SHIELD | Admitting: Family Medicine

## 2019-12-25 DIAGNOSIS — L0390 Cellulitis, unspecified: Secondary | ICD-10-CM | POA: Diagnosis not present

## 2020-03-27 DIAGNOSIS — Z20822 Contact with and (suspected) exposure to covid-19: Secondary | ICD-10-CM | POA: Diagnosis not present

## 2020-07-27 DIAGNOSIS — Z20822 Contact with and (suspected) exposure to covid-19: Secondary | ICD-10-CM | POA: Diagnosis not present

## 2020-12-02 ENCOUNTER — Other Ambulatory Visit: Payer: Self-pay

## 2020-12-02 ENCOUNTER — Encounter: Payer: Self-pay | Admitting: Family Medicine

## 2020-12-02 ENCOUNTER — Ambulatory Visit (INDEPENDENT_AMBULATORY_CARE_PROVIDER_SITE_OTHER): Payer: BC Managed Care – PPO | Admitting: Family Medicine

## 2020-12-02 VITALS — BP 112/78 | HR 73 | Temp 97.7°F | Ht 64.5 in | Wt 291.0 lb

## 2020-12-02 DIAGNOSIS — Z1159 Encounter for screening for other viral diseases: Secondary | ICD-10-CM | POA: Diagnosis not present

## 2020-12-02 DIAGNOSIS — Z0000 Encounter for general adult medical examination without abnormal findings: Secondary | ICD-10-CM

## 2020-12-02 DIAGNOSIS — Z23 Encounter for immunization: Secondary | ICD-10-CM

## 2020-12-02 DIAGNOSIS — Z833 Family history of diabetes mellitus: Secondary | ICD-10-CM

## 2020-12-02 LAB — LIPID PANEL
Cholesterol: 170 mg/dL (ref 0–200)
HDL: 37.2 mg/dL — ABNORMAL LOW (ref >39.00)
LDL Cholesterol: 106 mg/dL — ABNORMAL HIGH (ref 0–99)
NonHDL: 132.87
Total CHOL/HDL Ratio: 5
Triglycerides: 133 mg/dL (ref 0.0–149.0)
VLDL: 26.6 mg/dL (ref 0.0–40.0)

## 2020-12-02 LAB — COMPREHENSIVE METABOLIC PANEL
ALT: 12 U/L (ref 0–35)
AST: 11 U/L (ref 0–37)
Albumin: 3.9 g/dL (ref 3.5–5.2)
Alkaline Phosphatase: 89 U/L (ref 39–117)
BUN: 8 mg/dL (ref 6–23)
CO2: 27 mEq/L (ref 19–32)
Calcium: 9 mg/dL (ref 8.4–10.5)
Chloride: 106 mEq/L (ref 96–112)
Creatinine, Ser: 0.75 mg/dL (ref 0.40–1.20)
GFR: 106.46 mL/min (ref >60.00)
Glucose, Bld: 79 mg/dL (ref 70–99)
Potassium: 4 mEq/L (ref 3.5–5.1)
Sodium: 140 mEq/L (ref 135–145)
Total Bilirubin: 0.4 mg/dL (ref 0.2–1.2)
Total Protein: 6.6 g/dL (ref 6.0–8.3)

## 2020-12-02 LAB — HEMOGLOBIN A1C: Hgb A1c MFr Bld: 5.5 % (ref 4.6–6.5)

## 2020-12-02 NOTE — Progress Notes (Signed)
Annual Exam   Chief Complaint:  Chief Complaint  Patient presents with   Annual Exam    History of Present Illness:  Ms. Regina Kidd is a 30 y.o. G1P1001 who LMP was Patient's last menstrual period was 12/02/2020 (exact date)., presents today for her annual examination.      Nutrition/Lifestyle Diet: wanting drinks more, not the healthiest diet - tries to do portion control Exercise: walking - with 31 year old She does get adequate calcium and Vitamin D in her diet.  Social History   Tobacco Use  Smoking Status Never  Smokeless Tobacco Never   Social History   Substance and Sexual Activity  Alcohol Use Yes   Alcohol/week: 1.0 standard drink   Types: 1 Cans of beer per week   Comment: rare use   Social History   Substance and Sexual Activity  Drug Use No     Safety The patient wears seatbelts: yes.     The patient feels safe at home and in their relationships: yes.  General Health Dentist in the last year: Yes Eye doctor: no  Menstrual Regular, no concerns  GYN She is single partner, contraception - none.     Cervical Cancer Screening (Age 62-65) Last Pap:  September 2021 Results were: no abnormalities with HPV negative  Family History of Breast Cancer: MGM Family History of Ovarian Cancer: no    Weight Wt Readings from Last 3 Encounters:  12/02/20 291 lb (132 kg)  11/19/19 282 lb 12 oz (128.3 kg)  06/27/18 286 lb (129.7 kg)   Patient has high BMI  BMI Readings from Last 1 Encounters:  12/02/20 49.18 kg/m     Chronic disease screening Blood pressure monitoring:  BP Readings from Last 3 Encounters:  12/02/20 112/78  11/19/19 122/78  06/30/18 117/73     Lipid Monitoring: Indication for screening: age >21, obesity, diabetes, family hx, CV risk factors.  Lipid screening: Yes  Lab Results  Component Value Date   CHOL 189 11/19/2019   HDL 38.50 (L) 11/19/2019   LDLCALC 124 (H) 11/19/2019   TRIG 134.0 11/19/2019   CHOLHDL 5  11/19/2019     Diabetes Screening: age >49, overweight, family hx, PCOS, hx of gestational diabetes, at risk ethnicity, elevated blood pressure >135/80.  Diabetes Screening screening: Yes  Lab Results  Component Value Date   HGBA1C 5.6 11/19/2019      History reviewed. No pertinent past medical history.  History reviewed. No pertinent surgical history.  Prior to Admission medications   Medication Sig Start Date End Date Taking? Authorizing Provider  cetirizine (ZYRTEC) 10 MG tablet Take 10 mg by mouth daily.   Yes [provider]    No Known Allergies  Gynecologic History: Patient's last menstrual period was 12/02/2020 (exact date).  Obstetric History: G1P1001  Social History   Socioeconomic History   Marital status: Married    Spouse name: Heath   Number of children: 1   Years of education: college   Highest education level: Not on file  Occupational History   Not on file  Tobacco Use   Smoking status: Never   Smokeless tobacco: Never  Substance and Sexual Activity   Alcohol use: Yes    Alcohol/week: 1.0 standard drink    Types: 1 Cans of beer per week    Comment: rare use   Drug use: No   Sexual activity: Yes    Partners: Male    Birth control/protection: None  Other Topics Concern  Not on file  Social History Narrative   11/19/19   From: the area   Living: with husband, Vilma Prader (2016)   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, walk with son, play outside with son      Exercise: tries to walk, play with son   Diet: oatmeal/cheerios for breakfast, leftovers for lunch or fastfood or freezer food, home cooked 50/50      Safety   Seat belts: Yes    Guns: Yes  and secure   Safe in relationships: Yes    Social Determinants of Corporate investment banker Strain: Not on file  Food Insecurity: Not on file  Transportation  Needs: Not on file  Physical Activity: Not on file  Stress: Not on file  Social Connections: Not on file  Intimate Partner Violence: Not on file    Family History  Problem Relation Age of Onset   Hypertension Maternal Grandmother    Diabetes Maternal Grandmother    Breast cancer Maternal Grandmother    Hypertension Paternal Grandmother    Hypertension Paternal Grandfather    Diabetes Paternal Grandfather    Thyroid disease Mother    Hypertension Father     Review of Systems  Constitutional:  Negative for chills and fever.  HENT:  Negative for congestion and sore throat.   Eyes:  Negative for blurred vision and double vision.  Respiratory:  Negative for shortness of breath.   Cardiovascular:  Negative for chest pain.  Gastrointestinal:  Negative for heartburn, nausea and vomiting.  Genitourinary: Negative.   Musculoskeletal: Negative.  Negative for myalgias.  Skin:  Negative for rash.  Neurological:  Negative for dizziness and headaches.  Endo/Heme/Allergies:  Does not bruise/bleed easily.  Psychiatric/Behavioral:  Negative for depression. The patient is not nervous/anxious.     Physical Exam BP 112/78   Pulse 73   Temp 97.7 F (36.5 C) (Temporal)   Ht 5' 4.5" (1.638 m)   Wt 291 lb (132 kg)   LMP 12/02/2020 (Exact Date)   SpO2 97%   BMI 49.18 kg/m    BP Readings from Last 3 Encounters:  12/02/20 112/78  11/19/19 122/78  06/30/18 117/73    Wt Readings from Last 3 Encounters:  12/02/20 291 lb (132 kg)  11/19/19 282 lb 12 oz (128.3 kg)  06/27/18 286 lb (129.7 kg)     Physical Exam Constitutional:      General: She is not in acute distress.    Appearance: She is well-developed. She is not diaphoretic.  HENT:     Head: Normocephalic and atraumatic.     Right Ear: External ear normal.     Left Ear: External ear normal.     Nose: Nose normal.  Eyes:     General: No scleral icterus.    Extraocular Movements: Extraocular movements intact.      Conjunctiva/sclera: Conjunctivae normal.  Cardiovascular:     Rate and Rhythm: Normal rate and regular rhythm.     Heart sounds: No murmur heard. Pulmonary:     Effort: Pulmonary effort is normal. No respiratory distress.     Breath sounds: Normal breath sounds. No wheezing.  Abdominal:     General: Bowel sounds are normal. There is no distension.     Palpations: Abdomen is soft. There is no mass.     Tenderness: There is  no abdominal tenderness. There is no guarding or rebound.  Musculoskeletal:        General: Normal range of motion.     Cervical back: Neck supple.  Lymphadenopathy:     Cervical: No cervical adenopathy.  Skin:    General: Skin is warm and dry.     Capillary Refill: Capillary refill takes less than 2 seconds.  Neurological:     Mental Status: She is alert and oriented to person, place, and time.     Deep Tendon Reflexes: Reflexes normal.  Psychiatric:        Mood and Affect: Mood normal.        Behavior: Behavior normal.      Results: Depression screen [facility] 2/9 12/02/2020 11/19/2019 01/08/2017  Decreased Interest 0 1 0  Down, Depressed, Hopeless 0 0 0  PHQ - 2 Score 0 1 0  Altered sleeping - 0 -  Tired, decreased energy - 1 -  Change in appetite - 1 -  Feeling bad or failure about yourself  - 0 -  Trouble concentrating - 0 -  Moving slowly or fidgety/restless - 0 -  Suicidal thoughts - 0 -  PHQ-9 Score - 3 -      Assessment: 30 y.o. G10P1001 female here for routine annual examination.  Plan: Problem List Items Addressed This Visit       Other   Morbid obesity (HCC)   Relevant Orders   Comprehensive metabolic panel   Lipid panel   Other Visit Diagnoses     Annual physical exam    -  Primary   Relevant Orders   Comprehensive metabolic panel   Lipid panel   Hemoglobin A1c   Family history of diabetes mellitus       Relevant Orders   Hemoglobin A1c   Need for hepatitis C screening test       Relevant Orders   Hepatitis C antibody   Need  for influenza vaccination       Relevant Orders   Flu Vaccine QUAD 6+ mos PF IM (Fluarix Quad PF) (Completed)        Screening: -- Blood pressure screen normal -- cholesterol screening: will obtain -- Weight screening: obese: discussed management options, including lifestyle, dietary, and exercise. -- Diabetes Screening: will obtain -- Nutrition: encouraged healthy diet   Psych -- Depression screening (PHQ-9):  Flowsheet Row Office Visit from 11/19/2019 in [facility]  PHQ-9 Total Score 3        Safety -- tobacco screening: not using -- alcohol screening:  low-risk usage. -- no evidence of domestic violence or intimate partner violence.   Cancer Screening -- pap smear not collected per ASCCP guidelines -- family history of breast cancer screening: done. not at high risk.   Immunizations Immunization History  Administered Date(s) Administered   Influenza,inj,Quad PF,6+ Mos 12/02/2020   Moderna Sars-Covid-2 Vaccination 11/03/2019   Tdap 01/08/2017    -- flu vaccine up to date -- TDAP q10 years up to date -- Covid-19 Vaccine not up to date - encouraged newest booster  Encouraged regular vision and dental screening. Encouraged healthy exercise and diet.   Lynnda Child

## 2020-12-02 NOTE — Patient Instructions (Addendum)
Here is what I would recommend - for weight loss:  1) Increase the amount of water you drink a day > specifically drink a glass of water 8 oz or more before every meal 2) Could start a fiber supplement (like metamucil) > You could take this up to 3 times a day, but I would start with 1 time a day until you get used to it. It may cause some stomach upset 3) Make sure you sit down to eat and eat slowly (cut meat one piece at a time) > specifically train your body to eat only at the table (avoiding snacking in front of the TV) 4) Fill up on healthy items first > consider eating a salad with low calorie salad dressing before every meal  5) Make 1/2 of your plate vegetables 6) Keep healthy snacks available for those times when you are bored 7) Consider using a calorie counting app like MyFitnessPal > counting calories helps you make wise choices around snacking. Or if you can afford it you could sign up for Weight Watchers -- and learn about healthy options through a point system    Call if wanting to see a nutritionist

## 2020-12-03 LAB — HEPATITIS C ANTIBODY
Hepatitis C Ab: NONREACTIVE
SIGNAL TO CUT-OFF: 0.02 (ref <1.00)

## 2021-09-28 DIAGNOSIS — Z3201 Encounter for pregnancy test, result positive: Secondary | ICD-10-CM | POA: Diagnosis not present

## 2021-10-19 DIAGNOSIS — Z3A08 8 weeks gestation of pregnancy: Secondary | ICD-10-CM | POA: Diagnosis not present

## 2021-10-19 DIAGNOSIS — Z0142 Encounter for cervical smear to confirm findings of recent normal smear following initial abnormal smear: Secondary | ICD-10-CM | POA: Diagnosis not present

## 2021-10-19 DIAGNOSIS — Z1272 Encounter for screening for malignant neoplasm of vagina: Secondary | ICD-10-CM | POA: Diagnosis not present

## 2021-10-19 DIAGNOSIS — Z124 Encounter for screening for malignant neoplasm of cervix: Secondary | ICD-10-CM | POA: Diagnosis not present

## 2021-10-19 DIAGNOSIS — Z3689 Encounter for other specified antenatal screening: Secondary | ICD-10-CM | POA: Diagnosis not present

## 2021-10-19 DIAGNOSIS — Z1151 Encounter for screening for human papillomavirus (HPV): Secondary | ICD-10-CM | POA: Diagnosis not present

## 2021-10-19 DIAGNOSIS — O26891 Other specified pregnancy related conditions, first trimester: Secondary | ICD-10-CM | POA: Diagnosis not present

## 2021-10-19 DIAGNOSIS — Z113 Encounter for screening for infections with a predominantly sexual mode of transmission: Secondary | ICD-10-CM | POA: Diagnosis not present

## 2021-10-19 LAB — OB RESULTS CONSOLE ABO/RH: RH Type: NEGATIVE

## 2021-10-19 LAB — OB RESULTS CONSOLE HIV ANTIBODY (ROUTINE TESTING)
HIV: NONREACTIVE
HIV: NONREACTIVE

## 2021-10-19 LAB — OB RESULTS CONSOLE GC/CHLAMYDIA
Chlamydia: NEGATIVE
Chlamydia: NEGATIVE
Neisseria Gonorrhea: NEGATIVE
Neisseria Gonorrhea: NEGATIVE

## 2021-10-19 LAB — OB RESULTS CONSOLE ANTIBODY SCREEN: Antibody Screen: NEGATIVE

## 2021-10-19 LAB — OB RESULTS CONSOLE VARICELLA ZOSTER ANTIBODY, IGG: Varicella: IMMUNE

## 2021-10-19 LAB — OB RESULTS CONSOLE HEPATITIS B SURFACE ANTIGEN
Hepatitis B Surface Ag: NEGATIVE
Hepatitis B Surface Ag: NEGATIVE

## 2021-10-19 LAB — OB RESULTS CONSOLE RUBELLA ANTIBODY, IGM
Rubella: IMMUNE
Rubella: IMMUNE

## 2021-10-19 LAB — OB RESULTS CONSOLE RPR
RPR: NONREACTIVE
RPR: NONREACTIVE

## 2021-11-22 DIAGNOSIS — Z23 Encounter for immunization: Secondary | ICD-10-CM | POA: Diagnosis not present

## 2021-11-22 DIAGNOSIS — Z3481 Encounter for supervision of other normal pregnancy, first trimester: Secondary | ICD-10-CM | POA: Diagnosis not present

## 2021-12-06 ENCOUNTER — Ambulatory Visit (INDEPENDENT_AMBULATORY_CARE_PROVIDER_SITE_OTHER): Payer: BC Managed Care – PPO | Admitting: Nurse Practitioner

## 2021-12-06 ENCOUNTER — Encounter: Payer: Self-pay | Admitting: Nurse Practitioner

## 2021-12-06 VITALS — BP 124/76 | HR 81 | Temp 97.2°F | Ht 65.0 in | Wt 292.0 lb

## 2021-12-06 DIAGNOSIS — Z0000 Encounter for general adult medical examination without abnormal findings: Secondary | ICD-10-CM

## 2021-12-06 DIAGNOSIS — H6122 Impacted cerumen, left ear: Secondary | ICD-10-CM | POA: Insufficient documentation

## 2021-12-06 DIAGNOSIS — H6123 Impacted cerumen, bilateral: Secondary | ICD-10-CM

## 2021-12-06 DIAGNOSIS — Z833 Family history of diabetes mellitus: Secondary | ICD-10-CM | POA: Diagnosis not present

## 2021-12-06 DIAGNOSIS — Z3A16 16 weeks gestation of pregnancy: Secondary | ICD-10-CM | POA: Insufficient documentation

## 2021-12-06 DIAGNOSIS — Z6841 Body Mass Index (BMI) 40.0 and over, adult: Secondary | ICD-10-CM | POA: Diagnosis not present

## 2021-12-06 LAB — LIPID PANEL
Cholesterol: 218 mg/dL — ABNORMAL HIGH (ref 0–200)
HDL: 49.3 mg/dL (ref >39.00)
NonHDL: 168.84
Total CHOL/HDL Ratio: 4
Triglycerides: 205 mg/dL — ABNORMAL HIGH (ref 0.0–149.0)
VLDL: 41 mg/dL — ABNORMAL HIGH (ref 0.0–40.0)

## 2021-12-06 LAB — CBC
HCT: 34.4 % — ABNORMAL LOW (ref 36.0–46.0)
Hemoglobin: 11.5 g/dL — ABNORMAL LOW (ref 12.0–15.0)
MCHC: 33.3 g/dL (ref 30.0–36.0)
MCV: 82.1 fl (ref 78.0–100.0)
Platelets: 187 10*3/uL (ref 150.0–400.0)
RBC: 4.19 Mil/uL (ref 3.87–5.11)
RDW: 14.8 % (ref 11.5–15.5)
WBC: 11.9 10*3/uL — ABNORMAL HIGH (ref 4.0–10.5)

## 2021-12-06 LAB — COMPREHENSIVE METABOLIC PANEL
ALT: 8 U/L (ref 0–35)
AST: 10 U/L (ref 0–37)
Albumin: 3.5 g/dL (ref 3.5–5.2)
Alkaline Phosphatase: 65 U/L (ref 39–117)
BUN: 7 mg/dL (ref 6–23)
CO2: 23 mEq/L (ref 19–32)
Calcium: 8.7 mg/dL (ref 8.4–10.5)
Chloride: 105 mEq/L (ref 96–112)
Creatinine, Ser: 0.57 mg/dL (ref 0.40–1.20)
GFR: 120.66 mL/min (ref >60.00)
Glucose, Bld: 78 mg/dL (ref 70–99)
Potassium: 4.2 mEq/L (ref 3.5–5.1)
Sodium: 137 mEq/L (ref 135–145)
Total Bilirubin: 0.4 mg/dL (ref 0.2–1.2)
Total Protein: 6 g/dL (ref 6.0–8.3)

## 2021-12-06 LAB — LDL CHOLESTEROL, DIRECT: Direct LDL: 145 mg/dL

## 2021-12-06 LAB — TSH: TSH: 1.97 u[IU]/mL (ref 0.35–5.50)

## 2021-12-06 LAB — HEMOGLOBIN A1C: Hgb A1c MFr Bld: 5.5 % (ref 4.6–6.5)

## 2021-12-06 NOTE — Assessment & Plan Note (Signed)
Second child per patient report.  First child was vaginal delivery states she is followed by [facility] Dr. Paula Compton.

## 2021-12-06 NOTE — Assessment & Plan Note (Signed)
Verbal consent obtained.  Patient was prepped per office policy a mixture of water and hydroperoxide was used.  Patient tolerated procedure well.  We were able to remove impaction on right ear but unable to remove full impaction of the left ear.  Patient was given instructions to get over-the-counter cerumen softening eardrops use as directed follow-up in 3 days to try to disimpact left ear.

## 2021-12-06 NOTE — Assessment & Plan Note (Signed)
Patient states she is walking several miles a day.  Continue working on healthy lifestyle modifications

## 2021-12-06 NOTE — Assessment & Plan Note (Signed)
Discussed age-appropriate immunizations and screening exams. 

## 2021-12-06 NOTE — Assessment & Plan Note (Signed)
Family history of diabetes pending A1c today

## 2021-12-06 NOTE — Progress Notes (Signed)
Established Patient Office Visit  Subjective   Patient ID: Regina Kidd, female    DOB: 07/23/1990  Age: 31 y.o. MRN: [id number]  Chief Complaint  Patient presents with   Annual Exam    HPI  for complete physical and follow up of chronic conditions.  Immunizations: -Tetanus:2020 -Influenza:11/22/2021 -Shingles: Too young -Pneumonia: Too young  -HPV: Currently pregnant followed by OB/GYN  Diet: Fair diet. States that breakfast is regular, still having morning sickness. Lunch (eat out, healthier options) is normal meal. And snacks the rest of th Exercise:  Walking a couple miles a day  Eye exam: PRN Dental exam: Completes semi-annually   Pap Smear: Completed in 2023. [facility]. Paula Compton  Mammogram: Too young does have family history of breast cancer in grandmother  Colonoscopy: Too young, currently average risk Lung Cancer Screening: N/A Dexa: N/A   Sleep: State tries to go to bed around 9p and gets up 645-7a. States that she has to urinate at night. Does snore and feels rested most of the time      Review of Systems  Constitutional:  Negative for chills and fever.  Respiratory:  Negative for shortness of breath.   Cardiovascular:  Negative for chest pain and leg swelling.  Gastrointestinal:  Positive for nausea and vomiting (occ). Negative for abdominal pain, constipation and diarrhea.       Bm daily   Genitourinary:  Negative for dysuria and hematuria.  Neurological:  Negative for tingling and headaches.  Psychiatric/Behavioral:  Negative for hallucinations and suicidal ideas.       Objective:     BP 124/76   Pulse 81   Temp (!) 97.2 F (36.2 C) (Temporal)   Ht 5\' 5"  (1.651 m)   Wt 292 lb (132.5 kg)   SpO2 99%   BMI 48.59 kg/m    Physical Exam Vitals and nursing note reviewed.  Constitutional:      Appearance: Normal appearance. She is obese.  HENT:     Right Ear: Ear canal and external ear normal. There is impacted  cerumen.     Left Ear: Ear canal and external ear normal. There is impacted cerumen.     Mouth/Throat:     Mouth: Mucous membranes are moist.     Pharynx: Oropharynx is clear.  Eyes:     Extraocular Movements: Extraocular movements intact.     Pupils: Pupils are equal, round, and reactive to light.  Cardiovascular:     Rate and Rhythm: Normal rate and regular rhythm.     Pulses: Normal pulses.     Heart sounds: Normal heart sounds.  Pulmonary:     Effort: Pulmonary effort is normal.     Breath sounds: Normal breath sounds.  Abdominal:     General: Bowel sounds are normal. There is no distension.     Palpations: There is no mass.     Tenderness: There is no abdominal tenderness.     Hernia: No hernia is present.  Musculoskeletal:     Right lower leg: No edema.     Left lower leg: No edema.  Lymphadenopathy:     Cervical: No cervical adenopathy.  Skin:    General: Skin is warm.  Neurological:     General: No focal deficit present.     Mental Status: She is alert.     Deep Tendon Reflexes:     Reflex Scores:      Bicep reflexes are 2+ on the right side and 2+ on  the left side.      Patellar reflexes are 2+ on the right side and 2+ on the left side.    Comments: Bilateral upper and lower extremity strength 5/5  Psychiatric:        Mood and Affect: Mood normal.        Behavior: Behavior normal.        Thought Content: Thought content normal.        Judgment: Judgment normal.      No results found for any visits on 12/06/21.    The ASCVD Risk score (Arnett DK, et al., 2019) failed to calculate for the following reasons:   The 2019 ASCVD risk score is only valid for ages 25 to 58    Assessment & Plan:   Problem List Items Addressed This Visit       Nervous and Auditory   Bilateral impacted cerumen    Verbal consent obtained.  Patient was prepped per office policy a mixture of water and hydroperoxide was used.  Patient tolerated procedure well.  We were able to  remove impaction on right ear but unable to remove full impaction of the left ear.  Patient was given instructions to get over-the-counter cerumen softening eardrops use as directed follow-up in 3 days to try to disimpact left ear.      Relevant Orders   Ear Lavage     Other   Preventative health care - Primary    Discussed age-appropriate immunizations and screening exams.      Relevant Orders   CBC   Comprehensive metabolic panel   Hemoglobin A1c   TSH   Lipid panel   Class 3 severe obesity due to excess calories without serious comorbidity with body mass index (BMI) of 45.0 to 49.9 in adult [facility])    Patient states she is walking several miles a day.  Continue working on healthy lifestyle modifications      Relevant Orders   Hemoglobin A1c   TSH   Lipid panel   Family history of diabetes mellitus    Family history of diabetes pending A1c today      Relevant Orders   Hemoglobin A1c   [redacted] weeks gestation of pregnancy    Second child per patient report.  First child was vaginal delivery states she is followed by [facility] Dr. Huel Cote.       Return in about 3 days (around 12/09/2021) for For cermuen impaction of left ear.    Audria Nine, NP

## 2021-12-06 NOTE — Patient Instructions (Signed)
Nice to see you today I will be in touch with the labs once I have them Follow up in 1 year for your next physical, sooner if you need [place]

## 2021-12-09 ENCOUNTER — Ambulatory Visit (INDEPENDENT_AMBULATORY_CARE_PROVIDER_SITE_OTHER): Payer: BC Managed Care – PPO | Admitting: Nurse Practitioner

## 2021-12-09 VITALS — BP 118/66 | HR 89 | Temp 96.9°F | Resp 16 | Ht 65.0 in | Wt 291.0 lb

## 2021-12-09 DIAGNOSIS — H6122 Impacted cerumen, left ear: Secondary | ICD-10-CM | POA: Diagnosis not present

## 2021-12-09 NOTE — Progress Notes (Signed)
   Established Patient Office Visit  Subjective   Patient ID: Regina Kidd, female    DOB: Dec 10, 1990  Age: 31 y.o. MRN: [id number]  Chief Complaint  Patient presents with   Cerumen Impaction    Left ear    HPI  Cerumen Impaction: Patient was seen and evaluated for preventative health exam on 12/06/2021.  On physical exam noted to have bilateral cerumen impaction.  We were able to disimpact the right ear canal but was unsuccessful in the left ear canal.  Patient was instructed to get over-the-counter cerumen softening drops and use as directed over the next 3 days to return to clinic for reevaluation.  Patient is here today for left cerumen impaction reevaluation.  Patient states that she has been using over-the-counter drops as instructed and feels like she is retaining some water in the ear.  No pain or discharge    Review of Systems  Constitutional:  Negative for chills and fever.  HENT:  Negative for ear discharge and ear pain.       Objective:     BP 118/66   Pulse 89   Temp (!) 96.9 F (36.1 C)   Resp 16   Ht 5\' 5"  (1.651 m)   Wt 291 lb (132 kg)   SpO2 98%   BMI 48.42 kg/m    Physical Exam Vitals and nursing note reviewed.  Constitutional:      Appearance: Normal appearance. She is obese.  HENT:     Right Ear: Tympanic membrane, ear canal and external ear normal.     Left Ear: Ear canal and external ear normal. There is impacted cerumen.  Neurological:     Mental Status: She is alert.      No results found for any visits on 12/09/21.    The ASCVD Risk score (Arnett DK, et al., 2019) failed to calculate for the following reasons:   The 2019 ASCVD risk score is only valid for ages 77 to 82    Assessment & Plan:   Problem List Items Addressed This Visit       Nervous and Auditory   Impacted cerumen of left ear - Primary    Verbal consent obtained.  Patient was prepped per office policy mixture of water and hydroperoxide use.  Left ear was  irrigated.  Patient tolerated procedure well.  Left ear was disimpacted.  TM and canal within normal limits post disimpaction.      Relevant Orders   Ear Lavage    Return in about 1 year (around 12/10/2022) for CPE and Labs.    Romilda Garret, NP

## 2021-12-09 NOTE — Assessment & Plan Note (Signed)
Verbal consent obtained.  Patient was prepped per office policy mixture of water and hydroperoxide use.  Left ear was irrigated.  Patient tolerated procedure well.  Left ear was disimpacted.  TM and canal within normal limits post disimpaction.

## 2022-01-05 DIAGNOSIS — O99212 Obesity complicating pregnancy, second trimester: Secondary | ICD-10-CM | POA: Diagnosis not present

## 2022-01-05 DIAGNOSIS — Z3A19 19 weeks gestation of pregnancy: Secondary | ICD-10-CM | POA: Diagnosis not present

## 2022-01-05 DIAGNOSIS — Z363 Encounter for antenatal screening for malformations: Secondary | ICD-10-CM | POA: Diagnosis not present

## 2022-02-02 DIAGNOSIS — Z362 Encounter for other antenatal screening follow-up: Secondary | ICD-10-CM | POA: Diagnosis not present

## 2022-02-02 DIAGNOSIS — O99212 Obesity complicating pregnancy, second trimester: Secondary | ICD-10-CM | POA: Diagnosis not present

## 2022-02-02 DIAGNOSIS — Z3A23 23 weeks gestation of pregnancy: Secondary | ICD-10-CM | POA: Diagnosis not present

## 2022-03-06 NOTE — L&D Delivery Note (Addendum)
Delivery Note   Pt progressed to complete dilation and I was called to room.  On arrival head was crowning so I quickly gloved and baby delivered in one push.  At 3:56 AM a healthy female was delivered via Vaginal, Spontaneous (Presentation:   Occiput Anterior).  APGAR: 8, 9; weight  pending Large amount of fluid/amnioinfusion with moderate meconium noted.  Baby cried vigorously at birth.  Placenta status: Spontaneous, Intact.  Cord:   with the following complications:  short.    Anesthesia:  Epidural, 1% lidocaine local Episiotomy:  None Lacerations:  One upper labial abrasion on left repaired for hemostasis Suture Repair: 3.0 vicryl rapide Est. Blood Loss (mL):  269mL  Mom to postpartum.  Baby to Couplet care / Skin to Skin.  Logan Bores 05/27/2022, 4:21 AM

## 2022-03-08 DIAGNOSIS — Z6791 Unspecified blood type, Rh negative: Secondary | ICD-10-CM | POA: Diagnosis not present

## 2022-03-08 DIAGNOSIS — Z23 Encounter for immunization: Secondary | ICD-10-CM | POA: Diagnosis not present

## 2022-03-08 DIAGNOSIS — Z3689 Encounter for other specified antenatal screening: Secondary | ICD-10-CM | POA: Diagnosis not present

## 2022-03-08 DIAGNOSIS — Z2913 Encounter for prophylactic Rho(D) immune globulin: Secondary | ICD-10-CM | POA: Diagnosis not present

## 2022-04-03 DIAGNOSIS — Z3A32 32 weeks gestation of pregnancy: Secondary | ICD-10-CM | POA: Diagnosis not present

## 2022-04-03 DIAGNOSIS — O3663X0 Maternal care for excessive fetal growth, third trimester, not applicable or unspecified: Secondary | ICD-10-CM | POA: Diagnosis not present

## 2022-05-03 DIAGNOSIS — Z3685 Encounter for antenatal screening for Streptococcus B: Secondary | ICD-10-CM | POA: Diagnosis not present

## 2022-05-03 LAB — OB RESULTS CONSOLE GBS: GBS: POSITIVE

## 2022-05-10 DIAGNOSIS — O133 Gestational [pregnancy-induced] hypertension without significant proteinuria, third trimester: Secondary | ICD-10-CM | POA: Diagnosis not present

## 2022-05-26 ENCOUNTER — Inpatient Hospital Stay (HOSPITAL_COMMUNITY): Payer: BC Managed Care – PPO | Admitting: Anesthesiology

## 2022-05-26 ENCOUNTER — Other Ambulatory Visit: Payer: Self-pay

## 2022-05-26 ENCOUNTER — Inpatient Hospital Stay (HOSPITAL_COMMUNITY)
Admission: RE | Admit: 2022-05-26 | Discharge: 2022-05-28 | DRG: 806 | Disposition: A | Discharge status: Home / Self Care | Payer: BC Managed Care – PPO | Attending: Obstetrics and Gynecology | Admitting: Obstetrics and Gynecology

## 2022-05-26 ENCOUNTER — Encounter (HOSPITAL_COMMUNITY): Payer: Self-pay | Admitting: Obstetrics and Gynecology

## 2022-05-26 DIAGNOSIS — O480 Post-term pregnancy: Secondary | ICD-10-CM | POA: Diagnosis not present

## 2022-05-26 DIAGNOSIS — D62 Acute posthemorrhagic anemia: Secondary | ICD-10-CM | POA: Diagnosis not present

## 2022-05-26 DIAGNOSIS — O9902 Anemia complicating childbirth: Secondary | ICD-10-CM | POA: Diagnosis present

## 2022-05-26 DIAGNOSIS — O26893 Other specified pregnancy related conditions, third trimester: Secondary | ICD-10-CM | POA: Diagnosis present

## 2022-05-26 DIAGNOSIS — Z3A40 40 weeks gestation of pregnancy: Secondary | ICD-10-CM

## 2022-05-26 DIAGNOSIS — Z6791 Unspecified blood type, Rh negative: Secondary | ICD-10-CM

## 2022-05-26 DIAGNOSIS — O99214 Obesity complicating childbirth: Secondary | ICD-10-CM | POA: Diagnosis not present

## 2022-05-26 DIAGNOSIS — Z23 Encounter for immunization: Secondary | ICD-10-CM | POA: Diagnosis not present

## 2022-05-26 DIAGNOSIS — O99824 Streptococcus B carrier state complicating childbirth: Secondary | ICD-10-CM | POA: Diagnosis present

## 2022-05-26 DIAGNOSIS — E669 Obesity, unspecified: Secondary | ICD-10-CM | POA: Diagnosis not present

## 2022-05-26 DIAGNOSIS — Z3490 Encounter for supervision of normal pregnancy, unspecified, unspecified trimester: Principal | ICD-10-CM | POA: Diagnosis present

## 2022-05-26 LAB — CBC
HCT: 33.4 % — ABNORMAL LOW (ref 36.0–46.0)
Hemoglobin: 10.9 g/dL — ABNORMAL LOW (ref 12.0–15.0)
MCH: 27.2 pg (ref 26.0–34.0)
MCHC: 32.6 g/dL (ref 30.0–36.0)
MCV: 83.3 fL (ref 80.0–100.0)
Platelets: 242 10*3/uL (ref 150–400)
RBC: 4.01 MIL/uL (ref 3.87–5.11)
RDW: 18.3 % — ABNORMAL HIGH (ref 11.5–15.5)
WBC: 15 10*3/uL — ABNORMAL HIGH (ref 4.0–10.5)
nRBC: 0 % (ref 0.0–0.2)

## 2022-05-26 LAB — TYPE AND SCREEN
ABO/RH(D): A NEG
Antibody Screen: NEGATIVE

## 2022-05-26 LAB — OB RESULTS CONSOLE RPR: RPR: NONREACTIVE

## 2022-05-26 MED ORDER — OXYCODONE-ACETAMINOPHEN 5-325 MG PO TABS: 1.0000 | ORAL_TABLET | ORAL | Status: DC | PRN

## 2022-05-26 MED ORDER — EPHEDRINE 5 MG/ML INJ
10.0000 mg | INTRAVENOUS | Status: DC | PRN
  Administered 2022-05-27: 10 mg via INTRAVENOUS
  Filled 2022-05-26: qty 5

## 2022-05-26 MED ORDER — TERBUTALINE SULFATE 1 MG/ML IJ SOLN: 0.2500 mg | Freq: Once | INTRAMUSCULAR | Status: DC | PRN

## 2022-05-26 MED ORDER — OXYTOCIN-SODIUM CHLORIDE 30-0.9 UT/500ML-% IV SOLN
2.5000 [IU]/h | INTRAVENOUS | Status: DC
  Administered 2022-05-27: 2.5 [IU]/h via INTRAVENOUS

## 2022-05-26 MED ORDER — LIDOCAINE HCL (PF) 1 % IJ SOLN
30.0000 mL | INTRAMUSCULAR | Status: DC | PRN
  Administered 2022-05-27: 30 mL via SUBCUTANEOUS
  Filled 2022-05-26: qty 30

## 2022-05-26 MED ORDER — SODIUM CHLORIDE 0.9 % IV SOLN
5.0000 10*6.[IU] | Freq: Once | INTRAVENOUS | Status: AC
  Administered 2022-05-26: 5 10*6.[IU] via INTRAVENOUS
  Filled 2022-05-26 (×2): qty 5

## 2022-05-26 MED ORDER — OXYTOCIN BOLUS FROM INFUSION
333.0000 mL | Freq: Once | INTRAVENOUS | Status: AC
  Administered 2022-05-27: 333 mL via INTRAVENOUS

## 2022-05-26 MED ORDER — ONDANSETRON HCL 4 MG/2ML IJ SOLN: 4.0000 mg | Freq: Four times a day (QID) | INTRAMUSCULAR | Status: DC | PRN

## 2022-05-26 MED ORDER — FENTANYL-BUPIVACAINE-NACL 0.5-0.125-0.9 MG/250ML-% EP SOLN
12.0000 mL/h | EPIDURAL | Status: DC | PRN
  Filled 2022-05-26: qty 250

## 2022-05-26 MED ORDER — PHENYLEPHRINE 80 MCG/ML (10ML) SYRINGE FOR IV PUSH (FOR BLOOD PRESSURE SUPPORT): 80.0000 ug | PREFILLED_SYRINGE | INTRAVENOUS | Status: DC | PRN

## 2022-05-26 MED ORDER — OXYCODONE-ACETAMINOPHEN 5-325 MG PO TABS: 2.0000 | ORAL_TABLET | ORAL | Status: DC | PRN

## 2022-05-26 MED ORDER — LACTATED RINGERS IV SOLN
500.0000 mL | Freq: Once | INTRAVENOUS | Status: AC
  Administered 2022-05-26: 500 mL via INTRAVENOUS

## 2022-05-26 MED ORDER — DIPHENHYDRAMINE HCL 50 MG/ML IJ SOLN: 12.5000 mg | INTRAMUSCULAR | Status: DC | PRN

## 2022-05-26 MED ORDER — LACTATED RINGERS IV SOLN
500.0000 mL | INTRAVENOUS | Status: DC | PRN
  Administered 2022-05-27: 1000 mL via INTRAVENOUS

## 2022-05-26 MED ORDER — LACTATED RINGERS IV SOLN: INTRAVENOUS | Status: DC

## 2022-05-26 MED ORDER — OXYTOCIN-SODIUM CHLORIDE 30-0.9 UT/500ML-% IV SOLN
1.0000 m[IU]/min | INTRAVENOUS | Status: DC
  Administered 2022-05-26: 2 m[IU]/min via INTRAVENOUS
  Filled 2022-05-26 (×2): qty 500

## 2022-05-26 MED ORDER — PENICILLIN G POT IN DEXTROSE 60000 UNIT/ML IV SOLN
3.0000 10*6.[IU] | INTRAVENOUS | Status: DC
  Administered 2022-05-27: 3 10*6.[IU] via INTRAVENOUS
  Filled 2022-05-26: qty 50

## 2022-05-26 MED ORDER — ACETAMINOPHEN 325 MG PO TABS: 650.0000 mg | ORAL_TABLET | ORAL | Status: DC | PRN

## 2022-05-26 MED ORDER — SOD CITRATE-CITRIC ACID 500-334 MG/5ML PO SOLN: 30.0000 mL | ORAL | Status: DC | PRN

## 2022-05-26 MED ORDER — EPHEDRINE 5 MG/ML INJ
10.0000 mg | INTRAVENOUS | Status: DC | PRN
  Administered 2022-05-27: 10 mg via INTRAVENOUS

## 2022-05-26 MED ORDER — FENTANYL CITRATE (PF) 100 MCG/2ML IJ SOLN: 50.0000 ug | INTRAMUSCULAR | Status: DC | PRN

## 2022-05-26 NOTE — H&P (Signed)
Regina Kidd is a 32 y.o. female G2P1001 at 6 0/7 weeks (EDD 05/25/21 by 8 week [place] inconsistent with LMP, menses had been running 35+ days) presenting for elective IOL at term. Prenatal care significant for maternal obesity and GBS positive status.  OB History     Gravida  2   Para  1   Term  1   Preterm      AB      Living  1      SAB      IAB      Ectopic      Multiple  0   Live Births  1         06-28-2018, 39.5 wks 1. M, 8lbs 3oz, Vaginal Delivery  SAB x 1  Past Medical History:  Diagnosis Date   Allergy    History reviewed. No pertinent surgical history. Family History: family history includes Breast cancer in her maternal grandmother; Diabetes in her maternal grandmother and paternal grandfather; Hypertension in her father, maternal grandmother, paternal grandfather, and paternal grandmother; Thyroid disease in her mother. Social History:  reports that she has never smoked. She has never used smokeless tobacco. She reports that she does not currently use alcohol after a past usage of about 1.0 standard drink of alcohol per week. She reports that she does not use drugs.     Maternal Diabetes: No Genetic Screening: Normal Maternal Ultrasounds/Referrals: Normal Fetal Ultrasounds or other Referrals:  None Maternal Substance Abuse:  No Significant Maternal Medications:  None Significant Maternal Lab Results:  Group B Strep positive and Rh negative Number of Prenatal Visits:greater than 3 verified prenatal visits Other Comments:  None  Review of Systems  Constitutional:  Negative for fever.  Gastrointestinal:  Negative for abdominal pain.  Genitourinary:  Negative for vaginal bleeding.   Maternal Medical History:  Contractions: Frequency: irregular.   Perceived severity is mild.   Fetal activity: Perceived fetal activity is normal.   Prenatal complications: Obesity, GBS positive Prenatal Complications - Diabetes: none.   Dilation:  2.5 Effacement (%): 50 Station: -2 Exam by:: Dr. Marvel Plan Blood pressure 110/84, pulse (!) 111, temperature (!) 97.5 F (36.4 C), temperature source Oral, resp. rate 18, height 5\' 5"  (1.651 m), weight (!) 138.5 kg. Maternal Exam:  Uterine Assessment: Contraction strength is mild.  Contraction frequency is irregular.  Abdomen: Patient reports no abdominal tenderness. Fetal presentation: vertex Introitus: Normal vulva. Normal vagina.    Physical Exam Constitutional:      Appearance: She is obese.  Cardiovascular:     Rate and Rhythm: Normal rate and regular rhythm.  Pulmonary:     Effort: Pulmonary effort is normal.  Abdominal:     Palpations: Abdomen is soft.  Genitourinary:    General: Normal vulva.  Neurological:     Mental Status: She is alert.  Psychiatric:        Mood and Affect: Mood normal.     Prenatal labs: ABO, Rh: A/Negative/-- (08/16 0000) Antibody: Negative (08/16 0000) Rubella: Immune (08/16 0000) RPR: Nonreactive (08/16 0000)  HBsAg: Negative (08/16 0000)  HIV: Non-reactive (08/16 0000)  GBS:   Positive NIPT low risk, female Hgb AA Essential panel negative 2019 x 3 One hour GCT 119  Assessment/Plan: Pt for IOL at term and started on PCN for GBS + Pitocin per protocol and will AROM when PCN on board and contracting    Logan Bores 05/26/2022, 8:53 PM

## 2022-05-26 NOTE — Anesthesia Procedure Notes (Signed)
Epidural Patient location during procedure: OB Start time: 05/26/2022 11:52 PM End time: 05/27/2022 12:08 AM  Staffing Anesthesiologist: Barnet Glasgow, MD Performed: anesthesiologist   Preanesthetic Checklist Completed: patient identified, IV checked, site marked, risks and benefits discussed, surgical consent, monitors and equipment checked, pre-op evaluation and timeout performed  Epidural Patient position: sitting Prep: DuraPrep and site prepped and draped Patient monitoring: continuous pulse ox and blood pressure Approach: midline Location: L3-L4 Injection technique: LOR air  Needle:  Needle type: Tuohy  Needle gauge: 17 G Needle length: 9 cm and 9 Needle insertion depth: 9 cm Catheter type: closed end flexible Catheter size: 19 Gauge Catheter at skin depth: 15 cm Test dose: negative  Assessment Events: blood not aspirated, no cerebrospinal fluid, injection not painful, no injection resistance, no paresthesia and negative IV test  Additional Notes Patient identified. Risks/Benefits/Options discussed with patient including but not limited to bleeding, infection, nerve damage, paralysis, failed block, incomplete pain control, headache, blood pressure changes, nausea, vomiting, reactions to medication both or allergic, itching and postpartum back pain. Confirmed with bedside nurse the patient's most recent platelet count. Confirmed with patient that they are not currently taking any anticoagulation, have any bleeding history or any family history of bleeding disorders. Patient expressed understanding and wished to proceed. All questions were answered. Sterile technique was used throughout the entire procedure. Please see nursing notes for vital signs. Test dose was given through epidural needle and negative prior to continuing to dose epidural or start infusion. Warning signs of high block given to the patient including shortness of breath, tingling/numbness in hands, complete  motor block, or any concerning symptoms with instructions to call for help. Patient was given instructions on fall risk and not to get out of bed. All questions and concerns addressed with instructions to call with any issues.  1 Attempt (S) . Patient tolerated procedure well.

## 2022-05-26 NOTE — Anesthesia Procedure Notes (Incomplete)
Epidural Patient location during procedure: OB Start time: 05/26/2022 11:52 PM  Staffing Anesthesiologist: Barnet Glasgow, MD Performed: anesthesiologist   Preanesthetic Checklist Completed: patient identified, IV checked, site marked, risks and benefits discussed, surgical consent, monitors and equipment checked, pre-op evaluation and timeout performed  Epidural Patient position: sitting Prep: DuraPrep and site prepped and draped Patient monitoring: continuous pulse ox and blood pressure Approach: midline Injection technique: LOR air  Needle:  Needle type: Tuohy  Needle gauge: 17 G Needle length: 9 cm and 9 Needle insertion depth: 5 cm cm Catheter type: closed end flexible Catheter size: 19 Gauge Catheter at skin depth: 10 cm Test dose: negative  Assessment Events: blood not aspirated, no cerebrospinal fluid, injection not painful, no injection resistance, no paresthesia and negative IV test  Additional Notes Patient identified. Risks/Benefits/Options discussed with patient including but not limited to bleeding, infection, nerve damage, paralysis, failed block, incomplete pain control, headache, blood pressure changes, nausea, vomiting, reactions to medication both or allergic, itching and postpartum back pain. Confirmed with bedside nurse the patient's most recent platelet count. Confirmed with patient that they are not currently taking any anticoagulation, have any bleeding history or any family history of bleeding disorders. Patient expressed understanding and wished to proceed. All questions were answered. Sterile technique was used throughout the entire procedure. Please see nursing notes for vital signs. Test dose was given through epidural needle and negative prior to continuing to dose epidural or start infusion. Warning signs of high block given to the patient including shortness of breath, tingling/numbness in hands, complete motor block, or any concerning symptoms with  instructions to call for help. Patient was given instructions on fall risk and not to get out of bed. All questions and concerns addressed with instructions to call with any issues.  Attempt (S) . Patient tolerated procedure well.

## 2022-05-26 NOTE — Anesthesia Preprocedure Evaluation (Signed)
Anesthesia Evaluation  Patient identified by MRN, date of birth, ID band Patient awake    Reviewed: Allergy & Precautions, NPO status , Patient's Chart, lab work & pertinent test results  Airway Mallampati: III  TM Distance: >3 FB Neck ROM: Full    Dental no notable dental hx.    Pulmonary neg pulmonary ROS   Pulmonary exam normal breath sounds clear to auscultation       Cardiovascular negative cardio ROS Normal cardiovascular exam Rhythm:Regular Rate:Normal     Neuro/Psych negative neurological ROS  negative psych ROS   GI/Hepatic negative GI ROS, Neg liver ROS,,,  Endo/Other  negative endocrine ROS    Renal/GU negative Renal ROS     Musculoskeletal   Abdominal  (+) + obese (BMI 50.8)  Peds  Hematology Lab Results      Component                Value               Date                      WBC                      15.0 (H)            05/26/2022                HGB                      10.9 (L)            05/26/2022                HCT                      33.4 (L)            05/26/2022                PLT                      242                 05/26/2022              Anesthesia Other Findings   Reproductive/Obstetrics (+) Pregnancy                             Anesthesia Physical Anesthesia Plan  ASA: 3  Anesthesia Plan: Epidural   Post-op Pain Management:    Induction:   PONV Risk Score and Plan:   Airway Management Planned:   Additional Equipment:   Intra-op Plan:   Post-operative Plan:   Informed Consent: I have reviewed the patients History and Physical, chart, labs and discussed the procedure including the risks, benefits and alternatives for the proposed anesthesia with the patient or authorized representative who has indicated his/her understanding and acceptance.       Plan Discussed with:   Anesthesia Plan Comments: (40 Wk G3P1 w BMI 50.8 for LEA)        Anesthesia Quick Evaluation

## 2022-05-27 ENCOUNTER — Encounter (HOSPITAL_COMMUNITY): Payer: Self-pay | Admitting: Obstetrics and Gynecology

## 2022-05-27 LAB — CBC
HCT: 30 % — ABNORMAL LOW (ref 36.0–46.0)
Hemoglobin: 9.8 g/dL — ABNORMAL LOW (ref 12.0–15.0)
MCH: 27 pg (ref 26.0–34.0)
MCHC: 32.7 g/dL (ref 30.0–36.0)
MCV: 82.6 fL (ref 80.0–100.0)
Platelets: 207 10*3/uL (ref 150–400)
RBC: 3.63 MIL/uL — ABNORMAL LOW (ref 3.87–5.11)
RDW: 18.4 % — ABNORMAL HIGH (ref 11.5–15.5)
WBC: 15.9 10*3/uL — ABNORMAL HIGH (ref 4.0–10.5)
nRBC: 0 % (ref 0.0–0.2)

## 2022-05-27 LAB — RPR: RPR Ser Ql: NONREACTIVE

## 2022-05-27 MED ORDER — PRENATAL MULTIVITAMIN CH
1.0000 | ORAL_TABLET | Freq: Every day | ORAL | Status: DC
  Administered 2022-05-27 – 2022-05-28 (×2): 1 via ORAL
  Filled 2022-05-27 (×2): qty 1

## 2022-05-27 MED ORDER — BENZOCAINE-MENTHOL 20-0.5 % EX AERO
1.0000 | INHALATION_SPRAY | CUTANEOUS | Status: DC | PRN
  Filled 2022-05-27: qty 56

## 2022-05-27 MED ORDER — COCONUT OIL OIL
1.0000 | TOPICAL_OIL | Status: DC | PRN
  Administered 2022-05-27: 1 via TOPICAL

## 2022-05-27 MED ORDER — LIDOCAINE HCL (PF) 1 % IJ SOLN
INTRAMUSCULAR | Status: DC | PRN
  Administered 2022-05-27: 5 mL via EPIDURAL

## 2022-05-27 MED ORDER — DIBUCAINE (PERIANAL) 1 % EX OINT: 1.0000 | TOPICAL_OINTMENT | CUTANEOUS | Status: DC | PRN

## 2022-05-27 MED ORDER — SIMETHICONE 80 MG PO CHEW: 80.0000 mg | CHEWABLE_TABLET | ORAL | Status: DC | PRN

## 2022-05-27 MED ORDER — ONDANSETRON HCL 4 MG/2ML IJ SOLN: 4.0000 mg | INTRAMUSCULAR | Status: DC | PRN

## 2022-05-27 MED ORDER — SENNOSIDES-DOCUSATE SODIUM 8.6-50 MG PO TABS
2.0000 | ORAL_TABLET | ORAL | Status: DC
  Administered 2022-05-27 – 2022-05-28 (×2): 2 via ORAL
  Filled 2022-05-27 (×2): qty 2

## 2022-05-27 MED ORDER — FENTANYL-BUPIVACAINE-NACL 0.5-0.125-0.9 MG/250ML-% EP SOLN
EPIDURAL | Status: DC | PRN
  Administered 2022-05-27: 12 mL/h via EPIDURAL

## 2022-05-27 MED ORDER — LACTATED RINGERS AMNIOINFUSION: INTRAVENOUS | Status: DC

## 2022-05-27 MED ORDER — ACETAMINOPHEN 325 MG PO TABS: 650.0000 mg | ORAL_TABLET | ORAL | Status: DC | PRN

## 2022-05-27 MED ORDER — TETANUS-DIPHTH-ACELL PERTUSSIS 5-2.5-18.5 LF-MCG/0.5 IM SUSY: 0.5000 mL | PREFILLED_SYRINGE | Freq: Once | INTRAMUSCULAR | Status: DC

## 2022-05-27 MED ORDER — ZOLPIDEM TARTRATE 5 MG PO TABS: 5.0000 mg | ORAL_TABLET | Freq: Every evening | ORAL | Status: DC | PRN

## 2022-05-27 MED ORDER — IBUPROFEN 600 MG PO TABS
600.0000 mg | ORAL_TABLET | Freq: Four times a day (QID) | ORAL | Status: DC
  Administered 2022-05-27 – 2022-05-28 (×6): 600 mg via ORAL
  Filled 2022-05-27 (×6): qty 1

## 2022-05-27 MED ORDER — ONDANSETRON HCL 4 MG PO TABS: 4.0000 mg | ORAL_TABLET | ORAL | Status: DC | PRN

## 2022-05-27 MED ORDER — DIPHENHYDRAMINE HCL 25 MG PO CAPS: 25.0000 mg | ORAL_CAPSULE | Freq: Four times a day (QID) | ORAL | Status: DC | PRN

## 2022-05-27 MED ORDER — WITCH HAZEL-GLYCERIN EX PADS: 1.0000 | MEDICATED_PAD | CUTANEOUS | Status: DC | PRN

## 2022-05-27 NOTE — Lactation Note (Addendum)
This note was copied from a baby's chart. Lactation Consultation Note  Patient Name: Regina Kidd [id number] Date: 05/27/2022 Age:33 hours Reason for consult: Initial assessment;Breastfeeding assistance;Term;Difficult latch  LC student entered room for initial postpartum visit. Baby was breast breastfeeding, when entered room, latch was not consistent.[facility] student provided assistance with latch, demonstrated breast compression, briefly improved. Baby was able to sustain latch with short spurts of sucking, noted sporadic swallows, then would come off, and was alert before rooting to latch again. Observed a bruise on upper right area of right breast when infant unlatched, and parent denied pain.  Birthing parent disclosed use of nipple shield and exclusive pumping for 9 months. Noted flat soft and compressible nipples. Parent's goal is to breastfeed for a longer period of time. Birthing parent requested a nipple shield educated on pros and cons of usage. Educated on pump usage, milk production expectations, hand expression, hand pump attachment, flange fit, milk storage guidelines, frequency of latching and or pumping, community resources, and support while in hospital and outpatient. Assisted birthing parent with pumping using size 21 mm flanges and after 15 minutes on Symphony DEBP, was able to remove about 3 mL and both parents were very excited. Educated on colostrum on nipples for nipple care, finger feeding droplets, spoon feeding, syringe feeding, hunger and fullness cues.  Feeding Plan: Offer the breast on cue 8+x a day, skin to skin, if using nipple shield, ensure pumping for 15 minutes to establish and protect mil supply while using a barier and importance of NS follow up with LC. Call out to RN or [place] if needed with breastfeeding assistance.  Maternal Data Does the patient have breastfeeding experience prior to this delivery?: Yes How long did the patient breastfeed?: Excluisvely pumped for  9 months  Feeding Mother's Current Feeding Choice: Breast Milk  LATCH Score Latch: Repeated attempts needed to sustain latch, nipple held in mouth throughout feeding, stimulation needed to elicit sucking reflex.  Audible Swallowing: A few with stimulation  Type of Nipple: Flat  Comfort (Breast/Nipple): Soft / non-tender  Hold (Positioning): Assistance needed to correctly position infant at breast and maintain latch.  LATCH Score: 6   Lactation Tools Discussed/Used Tools: Pump;Nipple Shields Nipple shield size: 20 Breast pump type: Double-Electric Breast Pump Pump Education: Setup, frequency, and cleaning;Milk Storage Reason for Pumping: Birthing parents request Pumping frequency: 1 Pumped volume: 3 mL  Interventions Interventions: Breast feeding basics reviewed;Assisted with latch;Breast massage;Breast compression;Adjust position;Position options;Expressed milk;DEBP;Education  Discharge Pump: DEBP;Personal;Hands Free  Consult Status Consult Status: Follow-up Date: 05/28/22 Follow-up type: In-patient   Debara Pickett, BSW, Lactation Student  05/27/2022, 12:46 PM

## 2022-05-27 NOTE — Progress Notes (Signed)
Patient ID: Regina Kidd, female   DOB: 05/02/1990, 32 y.o.   MRN: [id number] Pt received epidural and had some hypotension.  Responded to phenylephrine.  BP 123/53   vss  FHR has had moderate variability throughout  and baseline 135-145   She has had some variable decelerations and occasional lates  AROM light meconium and FSE/IUPC placed to follow FHR closely 80/4/-2    Will do position changes and maintain BP

## 2022-05-27 NOTE — Progress Notes (Signed)
Patient ID: Regina Kidd, female   DOB: 30-Aug-1990, 32 y.o.   MRN: [id number] Pt had a run of variable and late decelerations so amnioinfusion begun and pitocin turned off.  FHR responded and is now having moderate variability and only intermittent mild variables with contractions  Cervix has progressed from 5cm to 8-9cm in last hour so will leave pitocin off  Station -1 to 0  Continue to follow and allow to labor down as long as FHR allows

## 2022-05-27 NOTE — Progress Notes (Signed)
Patient up and walking, no complaints. Monitor.

## 2022-05-28 NOTE — Discharge Summary (Signed)
Postpartum Discharge Summary     Patient Name: Regina Kidd DOB: [birth year] MRN: [id number]  Date of admission: 05/26/2022 Delivery date:05/27/2022  Delivering provider: Paula Compton  Date of discharge: 05/28/2022  Admitting diagnosis: Encounter for elective induction of labor [Z34.90] NSVD (normal spontaneous vaginal delivery) [O80] Intrauterine pregnancy: [redacted]w[redacted]d     Secondary diagnosis:  Principal Problem:   Encounter for elective induction of labor Active Problems:   NSVD (normal spontaneous vaginal delivery)  Additional problems: morbid obesity, GBS+, RH negative    Discharge diagnosis: Term Pregnancy Delivered                                              Post partum procedures:rhogam Augmentation: AROM and Pitocin Complications: None  Hospital course: Induction of Labor With Vaginal Delivery   32 y.o. yo [id number] at [redacted]w[redacted]d was admitted to the hospital 05/26/2022 for induction of labor.  Indication for induction: Favorable cervix at term.  Patient had an labor course complicated bynothing. Membrane Rupture Time/Date: 12:56 AM ,05/27/2022   Delivery Method:Vaginal, Spontaneous  Episiotomy: None  Lacerations:  None  Details of delivery can be found in separate delivery note.  Patient had a postpartum course complicated by mild acute blood loss anemia. Patient is discharged home 05/28/22.  Newborn Data: Birth date:05/27/2022  Birth time:3:56 AM  Gender:Female  Living status:Living  Apgars:8 ,9  Weight:3250 g   Magnesium Sulfate received: No BMZ received: No Rhophylac:Yes Transfusion:No  Physical exam  Vitals:   05/27/22 1452 05/27/22 1602 05/27/22 2058 05/28/22 0500  BP: 106/68 110/70 109/63 111/64  Pulse: 86 80 93 89  Resp: 18 18 20 18   Temp: 98 F (36.7 C) 98.2 F (36.8 C) 97.9 F (36.6 C) 97.8 F (36.6 C)  TempSrc:  Axillary Oral Oral  SpO2:  98% 99% 99%  Weight:      Height:       General: alert, cooperative, and no distress Lochia:  appropriate Uterine Fundus: difficult to palpate d/t habitys Incision: N/A DVT Evaluation: No evidence of DVT seen on physical exam. Labs: Lab Results  Component Value Date   WBC 15.9 (H) 05/27/2022   HGB 9.8 (L) 05/27/2022   HCT 30.0 (L) 05/27/2022   MCV 82.6 05/27/2022   PLT 207 05/27/2022      Latest Ref Rng & Units 12/06/2021   10:03 AM  CMP  Glucose 70 - 99 mg/dL 78   BUN 6 - 23 mg/dL 7   Creatinine 0.40 - 1.20 mg/dL 0.57   Sodium 135 - 145 mEq/L 137   Potassium 3.5 - 5.1 mEq/L 4.2   Chloride 96 - 112 mEq/L 105   CO2 19 - 32 mEq/L 23   Calcium 8.4 - 10.5 mg/dL 8.7   Total Protein 6.0 - 8.3 g/dL 6.0   Total Bilirubin 0.2 - 1.2 mg/dL 0.4   Alkaline Phos 39 - 117 U/L 65   AST 0 - 37 U/L 10   ALT 0 - 35 U/L 8    Edinburgh Score:    05/27/2022    1:44 PM  Edinburgh Postnatal Depression Scale Screening Tool  I have been able to laugh and see the funny side of things. 0  I have looked forward with enjoyment to things. 0  I have blamed myself unnecessarily when things went wrong. 0  I have been anxious or worried for  no good reason. 1  I have felt scared or panicky for no good reason. 1  Things have been getting on top of me. 0  I have been so unhappy that I have had difficulty sleeping. 0  I have felt sad or miserable. 0  I have been so unhappy that I have been crying. 0  The thought of harming myself has occurred to me. 0  Edinburgh Postnatal Depression Scale Total 2      After visit meds:  Allergies as of 05/28/2022   No Known Allergies      Medication List     TAKE these medications    cetirizine 10 MG tablet Commonly known as: ZYRTEC Take 10 mg by mouth daily.   prenatal multivitamin Tabs tablet Take 1 tablet by mouth daily at 12 noon.         Discharge home in stable condition  Infant Disposition:home with mother Discharge instruction: per After Visit Summary and Postpartum booklet. Activity: Advance as tolerated. Pelvic rest for 6 weeks.   Diet: routine diet Anticipated Birth Control: Unsure Postpartum Appointment:4 weeks Additional Postpartum F/U: Postpartum Depression checkup Future Appointments:No future appointments. Follow up Visit:  [place]. Call in 4 week(s).   Contact information: [address]  [address] [place] [address] [phone]                     05/28/2022 Allyn Kenner, DO

## 2022-05-28 NOTE — Anesthesia Postprocedure Evaluation (Signed)
Anesthesia Post Note  Patient: Regina Kidd  Procedure(s) Performed: AN AD HOC LABOR EPIDURAL     Patient location during evaluation: Mother Baby Anesthesia Type: Epidural Level of consciousness: awake and alert and oriented Pain management: satisfactory to patient Vital Signs Assessment: post-procedure vital signs reviewed and stable Respiratory status: respiratory function stable Cardiovascular status: stable Postop Assessment: no headache, no backache, epidural receding, patient able to bend at knees, no signs of nausea or vomiting, adequate PO intake and able to ambulate Anesthetic complications: no   No notable events documented.  Last Vitals:  Vitals:   05/27/22 2058 05/28/22 0500  BP: 109/63 111/64  Pulse: 93 89  Resp: 20 18  Temp: 36.6 C 36.6 C  SpO2: 99% 99%    Last Pain:  Vitals:   05/28/22 1107  TempSrc:   PainSc: 1    Pain Goal:                   Regina Kidd

## 2022-05-29 ENCOUNTER — Telehealth: Payer: Self-pay

## 2022-05-29 DIAGNOSIS — Z713 Dietary counseling and surveillance: Secondary | ICD-10-CM | POA: Diagnosis not present

## 2022-05-29 DIAGNOSIS — Z00129 Encounter for routine child health examination without abnormal findings: Secondary | ICD-10-CM | POA: Diagnosis not present

## 2022-05-29 NOTE — Transitions of Care (Post Inpatient/ED Visit) (Signed)
   05/29/2022  Name: Regina Kidd MRN: [id number] DOB: Apr 12, 1990  Today's TOC FU Call Status: Today's TOC FU Call Status:: Successful TOC FU Call Competed TOC FU Call Complete Date: 05/29/22  Transition Care Management Follow-up Telephone Call Date of Discharge: 05/28/22 Discharge Facility: [facility]) Type of Discharge: Inpatient Admission Primary Inpatient Discharge Diagnosis:: normal spontaneous vaginal delivery How have you been since you were released from the hospital?: Better (states she and baby girl are doing good.  States this is her second child and things are easier this time.  States her daughter is nursing good) Any questions or concerns?: No  Items Reviewed: Did you receive and understand the discharge instructions provided?: Yes Medications obtained and verified?: Yes (Medications Reviewed) Any new allergies since your discharge?: No Dietary orders reviewed?: NA Do you have support at home?: Yes People in Home: spouse Name of Support/Comfort Primary Source: husband  [place] [address] [place] and Equipment/Supplies: [place] Ordered?: NA Any new equipment or medical supplies ordered?: NA  Functional Questionnaire: Do you need assistance with bathing/showering or dressing?: No Do you need assistance with meal preparation?: No Do you need assistance with eating?: No Do you have difficulty maintaining continence: No Do you need assistance with getting out of bed/getting out of a chair/moving?: No Do you have difficulty managing or taking your medications?: No  Follow up appointments reviewed: PCP Follow-up appointment confirmed?: NA (pt prefers to be followed by OB/GYN post partum) [facility] Follow-up appointment confirmed?: No Follow-Up Specialty Provider:: OB/GYN Reason Specialist Follow-Up Not Confirmed: Patient has Specialist Provider Number and will Call for Appointment (pt to call to schedule hospital follow up) Do you need transportation  to your follow-up appointment?: No Do you understand care options if your condition(s) worsen?: Yes-patient verbalized understanding  SDOH Interventions Today    Flowsheet Row Most Recent Value  SDOH Interventions   Food Insecurity Interventions Intervention Not Indicated  Transportation Interventions Intervention Not Indicated      TOC Interventions Today    Flowsheet Row Most Recent Value  TOC Interventions   TOC Interventions Discussed/Reviewed TOC Interventions Discussed, Post op wound/incision care, S/S of infection, Post discharge activity limitations per provider       SIGNATURE Peter Garter RN, BSN,CCM, [place] Management Coordinator [place] [facility] [phone]

## 2022-05-31 DIAGNOSIS — L22 Diaper dermatitis: Secondary | ICD-10-CM | POA: Diagnosis not present

## 2022-06-03 ENCOUNTER — Inpatient Hospital Stay (HOSPITAL_COMMUNITY): Payer: BC Managed Care – PPO

## 2022-06-08 ENCOUNTER — Telehealth (HOSPITAL_COMMUNITY): Payer: Self-pay

## 2022-06-08 NOTE — Telephone Encounter (Signed)
Preadmission testing 

## 2022-06-08 NOTE — Telephone Encounter (Signed)
Patient did not answer phone call. Voicemail left for patient.   [facility]

## 2022-07-05 DIAGNOSIS — K429 Umbilical hernia without obstruction or gangrene: Secondary | ICD-10-CM | POA: Diagnosis not present

## 2022-07-05 DIAGNOSIS — Z3009 Encounter for other general counseling and advice on contraception: Secondary | ICD-10-CM | POA: Diagnosis not present

## 2022-07-05 DIAGNOSIS — Z1331 Encounter for screening for depression: Secondary | ICD-10-CM | POA: Diagnosis not present

## 2022-07-25 DIAGNOSIS — K429 Umbilical hernia without obstruction or gangrene: Secondary | ICD-10-CM | POA: Diagnosis not present

## 2022-12-08 ENCOUNTER — Encounter: Payer: Self-pay | Admitting: Family

## 2022-12-08 ENCOUNTER — Ambulatory Visit (INDEPENDENT_AMBULATORY_CARE_PROVIDER_SITE_OTHER): Payer: BC Managed Care – PPO | Admitting: Family

## 2022-12-08 VITALS — BP 104/72 | HR 83 | Temp 97.1°F | Ht 65.0 in | Wt 303.6 lb

## 2022-12-08 DIAGNOSIS — K429 Umbilical hernia without obstruction or gangrene: Secondary | ICD-10-CM | POA: Insufficient documentation

## 2022-12-08 DIAGNOSIS — Z23 Encounter for immunization: Secondary | ICD-10-CM | POA: Diagnosis not present

## 2022-12-08 DIAGNOSIS — Z789 Other specified health status: Secondary | ICD-10-CM | POA: Insufficient documentation

## 2022-12-08 DIAGNOSIS — Z0000 Encounter for general adult medical examination without abnormal findings: Secondary | ICD-10-CM | POA: Diagnosis not present

## 2022-12-08 DIAGNOSIS — R600 Localized edema: Secondary | ICD-10-CM | POA: Insufficient documentation

## 2022-12-08 DIAGNOSIS — Z803 Family history of malignant neoplasm of breast: Secondary | ICD-10-CM | POA: Diagnosis not present

## 2022-12-08 DIAGNOSIS — E7800 Pure hypercholesterolemia, unspecified: Secondary | ICD-10-CM | POA: Diagnosis not present

## 2022-12-08 DIAGNOSIS — Z6841 Body Mass Index (BMI) 40.0 and over, adult: Secondary | ICD-10-CM

## 2022-12-08 DIAGNOSIS — E66813 Obesity, class 3: Secondary | ICD-10-CM

## 2022-12-08 LAB — BASIC METABOLIC PANEL
BUN: 8 mg/dL (ref 6–23)
CO2: 28 meq/L (ref 19–32)
Calcium: 9 mg/dL (ref 8.4–10.5)
Chloride: 104 meq/L (ref 96–112)
Creatinine, Ser: 0.73 mg/dL (ref 0.40–1.20)
GFR: 108.42 mL/min (ref >60.00)
Glucose, Bld: 84 mg/dL (ref 70–99)
Potassium: 4.4 meq/L (ref 3.5–5.1)
Sodium: 141 meq/L (ref 135–145)

## 2022-12-08 LAB — CBC
HCT: 39.7 % (ref 36.0–46.0)
Hemoglobin: 12.5 g/dL (ref 12.0–15.0)
MCHC: 31.5 g/dL (ref 30.0–36.0)
MCV: 80.1 fL (ref 78.0–100.0)
Platelets: 262 10*3/uL (ref 150.0–400.0)
RBC: 4.95 Mil/uL (ref 3.87–5.11)
RDW: 16.2 % — ABNORMAL HIGH (ref 11.5–15.5)
WBC: 9.8 10*3/uL (ref 4.0–10.5)

## 2022-12-08 LAB — LIPID PANEL
Cholesterol: 193 mg/dL (ref 0–200)
HDL: 53.9 mg/dL (ref >39.00)
LDL Cholesterol: 113 mg/dL — ABNORMAL HIGH (ref 0–99)
NonHDL: 139.35
Total CHOL/HDL Ratio: 4
Triglycerides: 132 mg/dL (ref 0.0–149.0)
VLDL: 26.4 mg/dL (ref 0.0–40.0)

## 2022-12-08 LAB — TSH: TSH: 3.41 u[IU]/mL (ref 0.35–5.50)

## 2022-12-08 NOTE — Progress Notes (Signed)
Subjective:  Patient ID: Regina Kidd, female    DOB: Sep 10, 1990  Age: 32 y.o. MRN: [id number]  Patient Care Team: Mort Sawyers, FNP as PCP - General (Family Medicine)   CC:  Chief Complaint  Patient presents with  . Establish Care    HPI Regina Kidd is a 32 y.o. female who presents today for an annual physical exam as well as here for [facility], prior provider was Dr. Selena Batten. She reports consuming a general diet.  Walking when she can with her daugther  She generally feels well. She reports sleeping fairly well. She does not have additional problems to discuss today.   Vision:Within last year Dental:Receives regular dental care  Last pap: 11/19/19 negative Flu shot, will get today.  Current breastfeeding  Pt is with acute concerns.  Left lower leg with increased swelling at times vs the right. Feels 'water retention' in her left lower leg at times since giving birth.      Advanced Directives Patient does not have advanced directives   DEPRESSION SCREENING    12/08/2022    8:50 AM 12/06/2021    9:02 AM 12/02/2020   10:21 AM 11/19/2019    9:40 AM 01/08/2017   11:33 AM 10/27/2014    9:29 AM  PHQ 2/9 Scores  PHQ - 2 Score 0 0 0 1 0 0  PHQ- 9 Score 1 0  3       ROS: Negative unless specifically indicated above in HPI.   No current outpatient medications on file.    Objective:    BP 104/72 (BP Location: Left Arm, Patient Position: Sitting, Cuff Size: Large)   Pulse 83   Temp (!) 97.1 F (36.2 C) (Temporal)   Ht 5\' 5"  (1.651 m)   Wt (!) 303 lb 9.6 oz (137.7 kg)   SpO2 96%   Breastfeeding Yes   BMI 50.52 kg/m   BP Readings from Last 3 Encounters:  12/08/22 104/72  05/28/22 111/64  12/09/21 118/66   Wt Readings from Last 3 Encounters:  12/08/22 (!) 303 lb 9.6 oz (137.7 kg)  05/26/22 (!) 305 lb 5.4 oz (138.5 kg)  12/09/21 291 lb (132 kg)      Physical Exam Constitutional:      General: She is not in acute distress.    Appearance: Normal appearance.  She is obese. She is not ill-appearing.  HENT:     Head: Normocephalic.     Right Ear: Tympanic membrane normal.     Left Ear: Tympanic membrane normal.     Nose: Nose normal.     Mouth/Throat:     Mouth: Mucous membranes are moist.  Eyes:     Extraocular Movements: Extraocular movements intact.     Pupils: Pupils are equal, round, and reactive to light.  Cardiovascular:     Rate and Rhythm: Normal rate and regular rhythm.  Pulmonary:     Effort: Pulmonary effort is normal.     Breath sounds: Normal breath sounds.  Abdominal:     General: Abdomen is flat. Bowel sounds are normal.     Palpations: Abdomen is soft.     Tenderness: There is no guarding or rebound.  Musculoskeletal:        General: Normal range of motion.     Cervical back: Normal range of motion.  Skin:    General: Skin is warm.     Capillary Refill: Capillary refill takes less than 2 seconds.  Neurological:     General: No  focal deficit present.     Mental Status: She is alert.  Psychiatric:        Mood and Affect: Mood normal.        Behavior: Behavior normal.        Thought Content: Thought content normal.        Judgment: Judgment normal.         Assessment & Plan:  Breastfeeding (infant)  Umbilical hernia without obstruction and without gangrene Assessment & Plan: Stable pt has seen general surgeon who is monitoring.    Family history of breast cancer  Elevated cholesterol -     Lipid panel  Encounter for general adult medical examination without abnormal findings Assessment & Plan: Patient Counseling(The following topics were reviewed):  Preventative care handout given to pt  Health maintenance and immunizations reviewed. Please refer to Health maintenance section. Pt advised on safe sex, wearing seatbelts in car, and proper nutrition labwork ordered today for annual Dental health: Discussed importance of regular tooth brushing, flossing, and dental visits.   Orders: -     Lipid  panel -     CBC -     Basic metabolic panel -     TSH  Class 3 severe obesity due to excess calories without serious comorbidity with body mass index (BMI) of 45.0 to 49.9 in adult [facility]) Assessment & Plan: Pt advised to work on diet and exercise as tolerated    Edema of left lower leg Assessment & Plan: Not duplicated on physical exam today or present.  Advised pt to watch salt intake, compression stockings as needed. Elevate legs as needed. Low suspicion for dvt, cellulitis, CHF and or kidney concern. Reassurance provided to patient.        Follow-up: Return in about 1 year (around 12/08/2023) for f/u CPE.   Mort Sawyers, FNP

## 2022-12-08 NOTE — Assessment & Plan Note (Signed)
Stable pt has seen general surgeon who is monitoring.

## 2022-12-08 NOTE — Assessment & Plan Note (Signed)
Patient Counseling(The following topics were reviewed): ? Preventative care handout given to pt  ?Health maintenance and immunizations reviewed. Please refer to Health maintenance section. ?Pt advised on safe sex, wearing seatbelts in car, and proper nutrition ?labwork ordered today for annual ?Dental health: Discussed importance of regular tooth brushing, flossing, and dental visits. ? ? ?

## 2022-12-08 NOTE — Assessment & Plan Note (Signed)
Pt advised to work on diet and exercise as tolerated  

## 2022-12-08 NOTE — Patient Instructions (Signed)
  Stop by the lab prior to leaving today. I will notify you of your results once received.   Recommendations on keeping yourself healthy:  - Exercise at least 30-45 minutes a day, 3-4 days a week.  - Eat a low-fat diet with lots of fruits and vegetables, up to 7-9 servings per day.  - Seatbelts can save your life. Wear them always.  - Smoke detectors on every level of your home, check batteries every year.  - Eye Doctor - have an eye exam every 1-2 years  - Safe sex - if you may be exposed to STDs, use a condom.  - Alcohol -  If you drink, do it moderately, less than 2 drinks per day.  - Health Care Power of Attorney. Choose someone to speak for you if you are not able.  - Depression is common in our stressful world.If you're feeling down or losing interest in things you normally enjoy, please come in for a visit.  - Violence - If anyone is threatening or hurting you, please call immediately.  Due to recent changes in healthcare laws, you may see results of your imaging and/or laboratory studies on MyChart before I have had a chance to review them.  I understand that in some cases there may be results that are confusing or concerning to you. Please understand that not all results are received at the same time and often I may need to interpret multiple results in order to provide you with the best plan of care or course of treatment. Therefore, I ask that you please give me 2 business days to thoroughly review all your results before contacting my office for clarification. Should we see a critical lab result, you will be contacted sooner.   I will see you again in one year for your annual comprehensive exam unless otherwise stated and or with acute concerns.  It was a pleasure seeing you today! Please do not hesitate to reach out with any questions and or concerns.  Regards,   Yarah Fuente    

## 2022-12-08 NOTE — Progress Notes (Deleted)
Established Patient Office Visit  Subjective:  Patient ID: Regina Kidd, female    DOB: October 11, 1990  Age: 32 y.o. MRN: [id number]  CC:  Chief Complaint  Patient presents with   Establish Care    HPI Regina Kidd is here for a transition of care visit.  Prior provider was:   Pt is with*** acute concerns.   chronic concerns:      Past Medical History:  Diagnosis Date   Allergy     No past surgical history on file.  Family History  Problem Relation Age of Onset   Hypertension Maternal Grandmother    Diabetes Maternal Grandmother    Breast cancer Maternal Grandmother    Hypertension Paternal Grandmother    Hypertension Paternal Grandfather    Diabetes Paternal Grandfather    Thyroid disease Mother    Hypertension Father     Social History   Socioeconomic History   Marital status: Married    Spouse name: Heath   Number of children: 1   Years of education: college   Highest education level: Not on file  Occupational History   Not on file  Tobacco Use   Smoking status: Never   Smokeless tobacco: Never  Vaping Use   Vaping status: Never Used  Substance and Sexual Activity   Alcohol use: Not Currently    Alcohol/week: 1.0 standard drink of alcohol    Types: 1 Cans of beer per week    Comment: rare use   Drug use: No   Sexual activity: Yes    Partners: Male    Birth control/protection: None  Other Topics Concern   Not on file  Social History Narrative   11/19/19   From: the area   Living: with husband, Vilma Prader (2016)   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, walk with son, play outside with son      Exercise: tries to walk, play with son   Diet: oatmeal/cheerios for breakfast, leftovers for lunch or fastfood or freezer food, home cooked 50/50      Safety   Seat belts: Yes    Guns: Yes  and secure   Safe in  relationships: Yes    Social Determinants of Health   Financial Resource Strain: Not on file  Food Insecurity: No Food Insecurity (05/29/2022)   Hunger Vital Sign    Worried About Running Out of Food in the Last Year: Never true    Ran Out of Food in the Last Year: Never true  Transportation Needs: No Transportation Needs (05/29/2022)   PRAPARE - Administrator, Civil Service (Medical): No    Lack of Transportation (Non-Medical): No  Physical Activity: Not on file  Stress: Not on file  Social Connections: Not on file  Intimate Partner Violence: Not At Risk (05/26/2022)   Humiliation, Afraid, Rape, and Kick questionnaire    Fear of Current or Ex-Partner: No    Emotionally Abused: No    Physically Abused: No    Sexually Abused: No    Outpatient Medications Prior to Visit  Medication Sig Dispense Refill   cetirizine (ZYRTEC) 10 MG tablet Take 10 mg by mouth daily.     Prenatal Vit-Fe Fumarate-FA (PRENATAL MULTIVITAMIN) TABS tablet Take 1 tablet by mouth daily at 12 noon.     No facility-administered medications prior to  visit.    No Known Allergies  ROS Review of Systems  Review of Systems  Respiratory:  Negative for shortness of breath.   Cardiovascular:  Negative for chest pain and palpitations.  Gastrointestinal:  Negative for constipation and diarrhea.  Genitourinary:  Negative for dysuria, frequency and urgency.  Musculoskeletal:  Negative for myalgias.  Psychiatric/Behavioral:  Negative for depression and suicidal ideas.   All other systems reviewed and are negative.    Objective:    Physical Exam  Gen: NAD, resting comfortably CV: RRR with no murmurs appreciated Pulm: NWOB, CTAB with no crackles, wheezes, or rhonchi Skin: warm, dry Psych: Normal affect and thought content  BP 104/72 (BP Location: Left Arm, Patient Position: Sitting, Cuff Size: Large)   Pulse 83   Temp (!) 97.1 F (36.2 C) (Temporal)   Ht 5\' 5"  (1.651 m)   Wt (!) 303 lb 9.6 oz  (137.7 kg)   SpO2 96%   Breastfeeding Yes   BMI 50.52 kg/m  Wt Readings from Last 3 Encounters:  12/08/22 (!) 303 lb 9.6 oz (137.7 kg)  05/26/22 (!) 305 lb 5.4 oz (138.5 kg)  12/09/21 291 lb (132 kg)     Health Maintenance Due  Topic Date Due   INFLUENZA VACCINE  10/05/2022   COVID-19 Vaccine (2 - 2023-24 season) 11/05/2022   Cervical Cancer Screening (HPV/Pap Cotest)  11/19/2022    There are no preventive care reminders to display for this patient.  Lab Results  Component Value Date   TSH 1.97 12/06/2021   Lab Results  Component Value Date   WBC 15.9 (H) 05/27/2022   HGB 9.8 (L) 05/27/2022   HCT 30.0 (L) 05/27/2022   MCV 82.6 05/27/2022   PLT 207 05/27/2022   Lab Results  Component Value Date   NA 137 12/06/2021   K 4.2 12/06/2021   CO2 23 12/06/2021   GLUCOSE 78 12/06/2021   BUN 7 12/06/2021   CREATININE 0.57 12/06/2021   BILITOT 0.4 12/06/2021   ALKPHOS 65 12/06/2021   AST 10 12/06/2021   ALT 8 12/06/2021   PROT 6.0 12/06/2021   ALBUMIN 3.5 12/06/2021   CALCIUM 8.7 12/06/2021   GFR 120.66 12/06/2021   Lab Results  Component Value Date   CHOL 218 (H) 12/06/2021   Lab Results  Component Value Date   HDL 49.30 12/06/2021   Lab Results  Component Value Date   LDLCALC 106 (H) 12/02/2020   Lab Results  Component Value Date   TRIG 205.0 (H) 12/06/2021   Lab Results  Component Value Date   CHOLHDL 4 12/06/2021   Lab Results  Component Value Date   HGBA1C 5.5 12/06/2021      Assessment & Plan:   There are no diagnoses linked to this encounter.  No orders of the defined types were placed in this encounter.   Follow-up: No follow-ups on file.    Mort Sawyers, FNP

## 2022-12-08 NOTE — Assessment & Plan Note (Signed)
Not duplicated on physical exam today or present.  Advised pt to watch salt intake, compression stockings as needed. Elevate legs as needed. Low suspicion for dvt, cellulitis, CHF and or kidney concern. Reassurance provided to patient.

## 2023-01-02 DIAGNOSIS — Z3201 Encounter for pregnancy test, result positive: Secondary | ICD-10-CM | POA: Diagnosis not present

## 2023-01-10 DIAGNOSIS — Z3A01 Less than 8 weeks gestation of pregnancy: Secondary | ICD-10-CM | POA: Diagnosis not present

## 2023-01-10 DIAGNOSIS — O3680X1 Pregnancy with inconclusive fetal viability, fetus 1: Secondary | ICD-10-CM | POA: Diagnosis not present

## 2023-01-31 DIAGNOSIS — Z3A09 9 weeks gestation of pregnancy: Secondary | ICD-10-CM | POA: Diagnosis not present

## 2023-01-31 DIAGNOSIS — Z113 Encounter for screening for infections with a predominantly sexual mode of transmission: Secondary | ICD-10-CM | POA: Diagnosis not present

## 2023-01-31 DIAGNOSIS — O26891 Other specified pregnancy related conditions, first trimester: Secondary | ICD-10-CM | POA: Diagnosis not present

## 2023-01-31 DIAGNOSIS — Z3689 Encounter for other specified antenatal screening: Secondary | ICD-10-CM | POA: Diagnosis not present

## 2023-01-31 LAB — HEPATITIS C ANTIBODY: HCV Ab: NEGATIVE

## 2023-01-31 LAB — OB RESULTS CONSOLE RPR: RPR: NONREACTIVE

## 2023-01-31 LAB — OB RESULTS CONSOLE RUBELLA ANTIBODY, IGM: Rubella: IMMUNE

## 2023-01-31 LAB — OB RESULTS CONSOLE HEPATITIS B SURFACE ANTIGEN: Hepatitis B Surface Ag: NEGATIVE

## 2023-01-31 LAB — OB RESULTS CONSOLE HIV ANTIBODY (ROUTINE TESTING): HIV: NONREACTIVE

## 2023-02-06 LAB — OB RESULTS CONSOLE GC/CHLAMYDIA
Chlamydia: NEGATIVE
Neisseria Gonorrhea: NEGATIVE

## 2023-02-16 DIAGNOSIS — Z3481 Encounter for supervision of other normal pregnancy, first trimester: Secondary | ICD-10-CM | POA: Diagnosis not present

## 2023-03-07 NOTE — L&D Delivery Note (Signed)
 Delivery Note Pt labored swiftly to complete with urge to push. She pushed three time and at 5:33 PM a viable female was delivered via Vaginal, Spontaneous (Presentation:   ROA   ).  APGAR: 9,9 ; weight pending  Anterior and posterior shoulders delivered easily with next two pushes and body easily followed. Vigorous spontaneous cry noted at delivery. Baby on mothers' chest  Cord clamped and cut after a minute delay and cord blood obtained .   Placenta status: delivered spontaneously, schultz,  .  Cord: 3vc  with the following complications: none .  Cord pH: n/a  Anesthesia: Epidural Episiotomy:  none Lacerations:  periurethral abrasion; small, hemostatic  Suture Repair: n/a Est. Blood Loss (mL):    Mom to postpartum.  Baby to Couplet care / Skin to Skin.  Ted LELON Solo 08/25/2023, 5:48 PM

## 2023-04-19 DIAGNOSIS — Z363 Encounter for antenatal screening for malformations: Secondary | ICD-10-CM | POA: Diagnosis not present

## 2023-04-19 DIAGNOSIS — Z3A21 21 weeks gestation of pregnancy: Secondary | ICD-10-CM | POA: Diagnosis not present

## 2023-05-16 DIAGNOSIS — Z3A24 24 weeks gestation of pregnancy: Secondary | ICD-10-CM | POA: Diagnosis not present

## 2023-05-16 DIAGNOSIS — O99212 Obesity complicating pregnancy, second trimester: Secondary | ICD-10-CM | POA: Diagnosis not present

## 2023-05-29 ENCOUNTER — Other Ambulatory Visit: Payer: Self-pay | Admitting: Obstetrics and Gynecology

## 2023-05-29 DIAGNOSIS — O99212 Obesity complicating pregnancy, second trimester: Secondary | ICD-10-CM

## 2023-06-08 DIAGNOSIS — Z6791 Unspecified blood type, Rh negative: Secondary | ICD-10-CM | POA: Diagnosis not present

## 2023-06-08 DIAGNOSIS — Z2913 Encounter for prophylactic Rho(D) immune globulin: Secondary | ICD-10-CM | POA: Diagnosis not present

## 2023-06-08 DIAGNOSIS — Z23 Encounter for immunization: Secondary | ICD-10-CM | POA: Diagnosis not present

## 2023-06-21 DIAGNOSIS — Z3A30 30 weeks gestation of pregnancy: Secondary | ICD-10-CM | POA: Diagnosis not present

## 2023-06-21 DIAGNOSIS — O99213 Obesity complicating pregnancy, third trimester: Secondary | ICD-10-CM | POA: Diagnosis not present

## 2023-06-27 ENCOUNTER — Ambulatory Visit (HOSPITAL_BASED_OUTPATIENT_CLINIC_OR_DEPARTMENT_OTHER): Admitting: Obstetrics

## 2023-06-27 ENCOUNTER — Ambulatory Visit: Attending: Obstetrics and Gynecology

## 2023-06-27 VITALS — BP 118/66 | HR 80

## 2023-06-27 DIAGNOSIS — O99213 Obesity complicating pregnancy, third trimester: Secondary | ICD-10-CM | POA: Diagnosis not present

## 2023-06-27 DIAGNOSIS — Z6841 Body Mass Index (BMI) 40.0 and over, adult: Secondary | ICD-10-CM

## 2023-06-27 DIAGNOSIS — E669 Obesity, unspecified: Secondary | ICD-10-CM

## 2023-06-27 DIAGNOSIS — Z3A30 30 weeks gestation of pregnancy: Secondary | ICD-10-CM | POA: Insufficient documentation

## 2023-06-27 DIAGNOSIS — Z3689 Encounter for other specified antenatal screening: Secondary | ICD-10-CM | POA: Diagnosis not present

## 2023-06-27 DIAGNOSIS — E66813 Obesity, class 3: Secondary | ICD-10-CM

## 2023-06-27 DIAGNOSIS — O2693 Pregnancy related conditions, unspecified, third trimester: Secondary | ICD-10-CM | POA: Diagnosis not present

## 2023-06-27 DIAGNOSIS — O99212 Obesity complicating pregnancy, second trimester: Secondary | ICD-10-CM | POA: Diagnosis not present

## 2023-06-27 NOTE — Progress Notes (Signed)
 MFM Consult Note  Regina Kidd is currently at 30 weeks and 6 days.  She was seen due to maternal obesity with a BMI of 51.  The views of the fetal anatomy were unable to be fully visualized during ultrasound exams performed in your office.    She denies any problems in her current pregnancy and has screened negative for gestational diabetes.  She had a cell free DNA test earlier in her pregnancy which indicated a low risk for trisomy 22, 40, and 13. A female fetus is predicted.  On today's exam, the overall EFW of 4 pounds 3 ounces measures at the 79th percentile for her gestational age.    There was normal amniotic fluid noted with a total AFI of 13.1 cm.  The views of the fetal anatomy were limited today due to her advanced gestational age, maternal obesity, and the fetal position.  She was advised to have her baby checked after birth to ensure that no anomalies are present.  The patient was informed that anomalies may be missed due to technical limitations. If the fetus is in a suboptimal position or maternal habitus is increased, visualization of the fetus in the maternal uterus may be impaired.   Due to maternal obesity, she should start weekly fetal testing at 34 weeks.  The patient may have these tests performed in your office.  No further exams were scheduled in our office as it is unlikely that we will be able to clear any more views of the fetal anatomy prior to delivery.    The patient stated that all of her questions were answered today.  A total of 30 minutes was spent counseling and coordinating the care for this patient.  Greater than 50% of the time was spent in direct face-to-face contact.

## 2023-07-03 DIAGNOSIS — Z3A31 31 weeks gestation of pregnancy: Secondary | ICD-10-CM | POA: Diagnosis not present

## 2023-07-03 DIAGNOSIS — O368131 Decreased fetal movements, third trimester, fetus 1: Secondary | ICD-10-CM | POA: Diagnosis not present

## 2023-07-18 DIAGNOSIS — O99019 Anemia complicating pregnancy, unspecified trimester: Secondary | ICD-10-CM | POA: Diagnosis not present

## 2023-07-25 DIAGNOSIS — Z3A34 34 weeks gestation of pregnancy: Secondary | ICD-10-CM | POA: Diagnosis not present

## 2023-07-25 DIAGNOSIS — O99213 Obesity complicating pregnancy, third trimester: Secondary | ICD-10-CM | POA: Diagnosis not present

## 2023-07-25 DIAGNOSIS — O3663X0 Maternal care for excessive fetal growth, third trimester, not applicable or unspecified: Secondary | ICD-10-CM | POA: Diagnosis not present

## 2023-08-02 DIAGNOSIS — Z3A36 36 weeks gestation of pregnancy: Secondary | ICD-10-CM | POA: Diagnosis not present

## 2023-08-02 DIAGNOSIS — Z3685 Encounter for antenatal screening for Streptococcus B: Secondary | ICD-10-CM | POA: Diagnosis not present

## 2023-08-02 DIAGNOSIS — O99213 Obesity complicating pregnancy, third trimester: Secondary | ICD-10-CM | POA: Diagnosis not present

## 2023-08-02 LAB — OB RESULTS CONSOLE GBS: GBS: POSITIVE

## 2023-08-08 DIAGNOSIS — Z3A36 36 weeks gestation of pregnancy: Secondary | ICD-10-CM | POA: Diagnosis not present

## 2023-08-08 DIAGNOSIS — O99213 Obesity complicating pregnancy, third trimester: Secondary | ICD-10-CM | POA: Diagnosis not present

## 2023-08-16 DIAGNOSIS — Z3A37 37 weeks gestation of pregnancy: Secondary | ICD-10-CM | POA: Diagnosis not present

## 2023-08-16 DIAGNOSIS — O99213 Obesity complicating pregnancy, third trimester: Secondary | ICD-10-CM | POA: Diagnosis not present

## 2023-08-21 ENCOUNTER — Encounter (HOSPITAL_COMMUNITY): Payer: Self-pay | Admitting: *Deleted

## 2023-08-21 ENCOUNTER — Telehealth (HOSPITAL_COMMUNITY): Payer: Self-pay | Admitting: *Deleted

## 2023-08-21 NOTE — Telephone Encounter (Signed)
 Preadmission screen

## 2023-08-23 DIAGNOSIS — O99213 Obesity complicating pregnancy, third trimester: Secondary | ICD-10-CM | POA: Diagnosis not present

## 2023-08-23 DIAGNOSIS — Z3A39 39 weeks gestation of pregnancy: Secondary | ICD-10-CM | POA: Diagnosis not present

## 2023-08-25 ENCOUNTER — Inpatient Hospital Stay (HOSPITAL_COMMUNITY)
Admission: RE | Admit: 2023-08-25 | Discharge: 2023-08-26 | DRG: 807 | Disposition: A | Discharge status: Home / Self Care | Attending: Obstetrics and Gynecology | Admitting: Obstetrics and Gynecology

## 2023-08-25 ENCOUNTER — Other Ambulatory Visit: Payer: Self-pay

## 2023-08-25 ENCOUNTER — Inpatient Hospital Stay (HOSPITAL_COMMUNITY)

## 2023-08-25 ENCOUNTER — Encounter (HOSPITAL_COMMUNITY): Payer: Self-pay | Admitting: Obstetrics and Gynecology

## 2023-08-25 ENCOUNTER — Inpatient Hospital Stay (HOSPITAL_COMMUNITY): Admitting: Anesthesiology

## 2023-08-25 DIAGNOSIS — Z051 Observation and evaluation of newborn for suspected infectious condition ruled out: Secondary | ICD-10-CM | POA: Diagnosis not present

## 2023-08-25 DIAGNOSIS — Z3A39 39 weeks gestation of pregnancy: Secondary | ICD-10-CM

## 2023-08-25 DIAGNOSIS — Z8249 Family history of ischemic heart disease and other diseases of the circulatory system: Secondary | ICD-10-CM | POA: Diagnosis not present

## 2023-08-25 DIAGNOSIS — Z23 Encounter for immunization: Secondary | ICD-10-CM | POA: Diagnosis not present

## 2023-08-25 DIAGNOSIS — Z833 Family history of diabetes mellitus: Secondary | ICD-10-CM

## 2023-08-25 DIAGNOSIS — O99824 Streptococcus B carrier state complicating childbirth: Secondary | ICD-10-CM | POA: Diagnosis present

## 2023-08-25 DIAGNOSIS — O9902 Anemia complicating childbirth: Secondary | ICD-10-CM | POA: Diagnosis not present

## 2023-08-25 DIAGNOSIS — O26893 Other specified pregnancy related conditions, third trimester: Secondary | ICD-10-CM | POA: Diagnosis not present

## 2023-08-25 DIAGNOSIS — Z3490 Encounter for supervision of normal pregnancy, unspecified, unspecified trimester: Principal | ICD-10-CM

## 2023-08-25 LAB — COMPREHENSIVE METABOLIC PANEL WITH GFR
ALT: 13 U/L (ref 0–44)
AST: 15 U/L (ref 15–41)
Albumin: 2.4 g/dL — ABNORMAL LOW (ref 3.5–5.0)
Alkaline Phosphatase: 115 U/L (ref 38–126)
Anion gap: 11 (ref 5–15)
BUN: 7 mg/dL (ref 6–20)
CO2: 19 mmol/L — ABNORMAL LOW (ref 22–32)
Calcium: 8.4 mg/dL — ABNORMAL LOW (ref 8.9–10.3)
Chloride: 108 mmol/L (ref 98–111)
Creatinine, Ser: 0.69 mg/dL (ref 0.44–1.00)
GFR, Estimated: >60 mL/min (ref >60)
Glucose, Bld: 108 mg/dL — ABNORMAL HIGH (ref 70–99)
Potassium: 3.9 mmol/L (ref 3.5–5.1)
Sodium: 138 mmol/L (ref 135–145)
Total Bilirubin: 0.6 mg/dL (ref 0.0–1.2)
Total Protein: 5.9 g/dL — ABNORMAL LOW (ref 6.5–8.1)

## 2023-08-25 LAB — CBC
HCT: 33.8 % — ABNORMAL LOW (ref 36.0–46.0)
Hemoglobin: 10.5 g/dL — ABNORMAL LOW (ref 12.0–15.0)
MCH: 25.7 pg — ABNORMAL LOW (ref 26.0–34.0)
MCHC: 31.1 g/dL (ref 30.0–36.0)
MCV: 82.8 fL (ref 80.0–100.0)
Platelets: 246 10*3/uL (ref 150–400)
RBC: 4.08 MIL/uL (ref 3.87–5.11)
RDW: 17.1 % — ABNORMAL HIGH (ref 11.5–15.5)
WBC: 11.8 10*3/uL — ABNORMAL HIGH (ref 4.0–10.5)
nRBC: 0 % (ref 0.0–0.2)

## 2023-08-25 LAB — TYPE AND SCREEN
ABO/RH(D): A NEG
Antibody Screen: NEGATIVE

## 2023-08-25 LAB — PROTEIN / CREATININE RATIO, URINE
Creatinine, Urine: 200 mg/dL
Protein Creatinine Ratio: 0.07 mg/mg{creat} (ref 0.00–0.15)
Total Protein, Urine: 13 mg/dL

## 2023-08-25 MED ORDER — OXYCODONE HCL 5 MG PO TABS: 10.0000 mg | ORAL_TABLET | ORAL | Status: DC | PRN

## 2023-08-25 MED ORDER — ONDANSETRON HCL 4 MG/2ML IJ SOLN: 4.0000 mg | Freq: Four times a day (QID) | INTRAMUSCULAR | Status: DC | PRN

## 2023-08-25 MED ORDER — LACTATED RINGERS IV SOLN: 500.0000 mL | Freq: Once | INTRAVENOUS | Status: DC

## 2023-08-25 MED ORDER — PHENYLEPHRINE 80 MCG/ML (10ML) SYRINGE FOR IV PUSH (FOR BLOOD PRESSURE SUPPORT): 80.0000 ug | PREFILLED_SYRINGE | INTRAVENOUS | Status: DC | PRN

## 2023-08-25 MED ORDER — SOD CITRATE-CITRIC ACID 500-334 MG/5ML PO SOLN: 30.0000 mL | ORAL | Status: DC | PRN

## 2023-08-25 MED ORDER — EPHEDRINE 5 MG/ML INJ: 10.0000 mg | INTRAVENOUS | Status: DC | PRN

## 2023-08-25 MED ORDER — ONDANSETRON HCL 4 MG PO TABS: 4.0000 mg | ORAL_TABLET | ORAL | Status: DC | PRN

## 2023-08-25 MED ORDER — SODIUM CHLORIDE 0.9 % IV SOLN
5.0000 10*6.[IU] | Freq: Once | INTRAVENOUS | Status: AC
  Administered 2023-08-25: 5 10*6.[IU] via INTRAVENOUS
  Filled 2023-08-25: qty 5

## 2023-08-25 MED ORDER — SIMETHICONE 80 MG PO CHEW: 80.0000 mg | CHEWABLE_TABLET | ORAL | Status: DC | PRN

## 2023-08-25 MED ORDER — FENTANYL-BUPIVACAINE-NACL 0.5-0.125-0.9 MG/250ML-% EP SOLN: 12.0000 mL/h | EPIDURAL | Status: DC | PRN

## 2023-08-25 MED ORDER — DIBUCAINE (PERIANAL) 1 % EX OINT: 1.0000 | TOPICAL_OINTMENT | CUTANEOUS | Status: DC | PRN

## 2023-08-25 MED ORDER — ACETAMINOPHEN 325 MG PO TABS: 650.0000 mg | ORAL_TABLET | ORAL | Status: DC | PRN

## 2023-08-25 MED ORDER — FENTANYL-BUPIVACAINE-NACL 0.5-0.125-0.9 MG/250ML-% EP SOLN
12.0000 mL/h | EPIDURAL | Status: DC | PRN
  Administered 2023-08-25: 12 mL/h via EPIDURAL
  Filled 2023-08-25: qty 250

## 2023-08-25 MED ORDER — OXYTOCIN-SODIUM CHLORIDE 30-0.9 UT/500ML-% IV SOLN: 2.5000 [IU]/h | INTRAVENOUS | Status: DC | PRN

## 2023-08-25 MED ORDER — PHENYLEPHRINE 80 MCG/ML (10ML) SYRINGE FOR IV PUSH (FOR BLOOD PRESSURE SUPPORT)
80.0000 ug | PREFILLED_SYRINGE | INTRAVENOUS | Status: DC | PRN
  Administered 2023-08-25: 160 ug via INTRAVENOUS

## 2023-08-25 MED ORDER — PHENYLEPHRINE 80 MCG/ML (10ML) SYRINGE FOR IV PUSH (FOR BLOOD PRESSURE SUPPORT)
80.0000 ug | PREFILLED_SYRINGE | INTRAVENOUS | Status: DC | PRN
  Filled 2023-08-25: qty 10

## 2023-08-25 MED ORDER — DIPHENHYDRAMINE HCL 25 MG PO CAPS
25.0000 mg | ORAL_CAPSULE | Freq: Four times a day (QID) | ORAL | Status: DC | PRN
Start: 2023-08-25 — End: 2023-08-26

## 2023-08-25 MED ORDER — DIPHENHYDRAMINE HCL 50 MG/ML IJ SOLN: 12.5000 mg | INTRAMUSCULAR | Status: DC | PRN

## 2023-08-25 MED ORDER — BENZOCAINE-MENTHOL 20-0.5 % EX AERO
1.0000 | INHALATION_SPRAY | CUTANEOUS | Status: DC | PRN
  Administered 2023-08-25: 1 via TOPICAL
  Filled 2023-08-25: qty 56

## 2023-08-25 MED ORDER — ZOLPIDEM TARTRATE 5 MG PO TABS
5.0000 mg | ORAL_TABLET | Freq: Every evening | ORAL | Status: DC | PRN
Start: 2023-08-25 — End: 2023-08-26

## 2023-08-25 MED ORDER — OXYTOCIN-SODIUM CHLORIDE 30-0.9 UT/500ML-% IV SOLN: 2.5000 [IU]/h | INTRAVENOUS | Status: DC

## 2023-08-25 MED ORDER — PRENATAL MULTIVITAMIN CH
1.0000 | ORAL_TABLET | Freq: Every day | ORAL | Status: DC
  Administered 2023-08-26: 1 via ORAL
  Filled 2023-08-25: qty 1

## 2023-08-25 MED ORDER — LACTATED RINGERS IV SOLN: INTRAVENOUS | Status: DC

## 2023-08-25 MED ORDER — IBUPROFEN 600 MG PO TABS
600.0000 mg | ORAL_TABLET | Freq: Four times a day (QID) | ORAL | Status: DC
  Administered 2023-08-25 – 2023-08-26 (×4): 600 mg via ORAL
  Filled 2023-08-25 (×4): qty 1

## 2023-08-25 MED ORDER — PENICILLIN G POT IN DEXTROSE 60000 UNIT/ML IV SOLN
3.0000 10*6.[IU] | INTRAVENOUS | Status: DC
  Administered 2023-08-25: 3 10*6.[IU] via INTRAVENOUS
  Filled 2023-08-25: qty 50

## 2023-08-25 MED ORDER — LIDOCAINE HCL (PF) 1 % IJ SOLN
INTRAMUSCULAR | Status: DC | PRN
  Administered 2023-08-25: 8 mL via EPIDURAL

## 2023-08-25 MED ORDER — LACTATED RINGERS IV SOLN: 500.0000 mL | INTRAVENOUS | Status: DC | PRN

## 2023-08-25 MED ORDER — OXYCODONE-ACETAMINOPHEN 5-325 MG PO TABS: 2.0000 | ORAL_TABLET | ORAL | Status: DC | PRN

## 2023-08-25 MED ORDER — OXYTOCIN-SODIUM CHLORIDE 30-0.9 UT/500ML-% IV SOLN
1.0000 m[IU]/min | INTRAVENOUS | Status: DC
  Administered 2023-08-25: 2 m[IU]/min via INTRAVENOUS
  Filled 2023-08-25: qty 500

## 2023-08-25 MED ORDER — COCONUT OIL OIL: 1.0000 | TOPICAL_OIL | Status: DC | PRN

## 2023-08-25 MED ORDER — TETANUS-DIPHTH-ACELL PERTUSSIS 5-2.5-18.5 LF-MCG/0.5 IM SUSY: 0.5000 mL | PREFILLED_SYRINGE | Freq: Once | INTRAMUSCULAR | Status: DC

## 2023-08-25 MED ORDER — OXYCODONE HCL 5 MG PO TABS: 5.0000 mg | ORAL_TABLET | ORAL | Status: DC | PRN

## 2023-08-25 MED ORDER — SENNOSIDES-DOCUSATE SODIUM 8.6-50 MG PO TABS
2.0000 | ORAL_TABLET | Freq: Every day | ORAL | Status: DC
  Administered 2023-08-26: 2 via ORAL
  Filled 2023-08-25: qty 2

## 2023-08-25 MED ORDER — ONDANSETRON HCL 4 MG/2ML IJ SOLN: 4.0000 mg | INTRAMUSCULAR | Status: DC | PRN

## 2023-08-25 MED ORDER — TERBUTALINE SULFATE 1 MG/ML IJ SOLN: 0.2500 mg | Freq: Once | INTRAMUSCULAR | Status: DC | PRN

## 2023-08-25 MED ORDER — LIDOCAINE HCL (PF) 1 % IJ SOLN: 30.0000 mL | INTRAMUSCULAR | Status: DC | PRN

## 2023-08-25 MED ORDER — OXYTOCIN BOLUS FROM INFUSION
333.0000 mL | Freq: Once | INTRAVENOUS | Status: AC
  Administered 2023-08-25: 333 mL via INTRAVENOUS

## 2023-08-25 MED ORDER — WITCH HAZEL-GLYCERIN EX PADS: 1.0000 | MEDICATED_PAD | CUTANEOUS | Status: DC | PRN

## 2023-08-25 MED ORDER — OXYCODONE-ACETAMINOPHEN 5-325 MG PO TABS: 1.0000 | ORAL_TABLET | ORAL | Status: DC | PRN

## 2023-08-25 NOTE — Anesthesia Procedure Notes (Signed)
 Epidural Patient location during procedure: OB Start time: 08/25/2023 1:39 PM End time: 08/25/2023 1:43 PM  Staffing Anesthesiologist: Mallory Manus, MD  Preanesthetic Checklist Completed: patient identified, IV checked, site marked, risks and benefits discussed, surgical consent, monitors and equipment checked, pre-op evaluation and timeout performed  Epidural Patient position: sitting Prep: DuraPrep and site prepped and draped Patient monitoring: continuous pulse ox and blood pressure Approach: midline Location: L4-L5 Injection technique: LOR air  Needle:  Needle type: Tuohy  Needle gauge: 17 G Needle length: 9 cm and 9 Needle insertion depth: 9 cm Catheter type: closed end flexible Catheter size: 19 Gauge Catheter at skin depth: 15 cm Test dose: negative  Assessment Events: blood not aspirated, no cerebrospinal fluid, injection not painful, no injection resistance, no paresthesia and negative IV test

## 2023-08-25 NOTE — Progress Notes (Signed)
 Patient ID: Regina Kidd, female   DOB: [birth year], 33 y.o.   MRN: [id number] Elevated BP noted . Pain reports pain well controlled.  Denies HA BP  137-149/79-104  Will check cmp and upr/cr  Will treat if indicated but monitor for now

## 2023-08-25 NOTE — Anesthesia Postprocedure Evaluation (Signed)
 Anesthesia Post Note  Patient: Regina Kidd  Procedure(s) Performed: AN AD HOC LABOR EPIDURAL     Patient location during evaluation: Mother Baby Anesthesia Type: Epidural Level of consciousness: awake and alert Pain management: pain level controlled Vital Signs Assessment: post-procedure vital signs reviewed and stable Respiratory status: spontaneous breathing, nonlabored ventilation and respiratory function stable Cardiovascular status: stable Postop Assessment: no headache, no backache and epidural receding Anesthetic complications: no   No notable events documented.  Last Vitals:  Vitals:   08/25/23 1846 08/25/23 1924  BP: 132/81 (!) 148/71  Pulse: 81 88  Resp: 16 16  Temp:  36.8 C    Last Pain:  Vitals:   08/25/23 1924  TempSrc: Oral  PainSc:    Pain Goal:                Epidural/Spinal Function Cutaneous sensation: Able to Wiggle Toes (08/25/23 1922), Patient able to flex knees: Yes (08/25/23 1922), Patient able to lift hips off bed: Yes (08/25/23 1922), Back pain beyond tenderness at insertion site: No (08/25/23 1922), Progressively worsening motor and/or sensory loss: No (08/25/23 1922), Bowel and/or bladder incontinence post epidural: No (08/25/23 1922)  Giani Betzold

## 2023-08-25 NOTE — Anesthesia Preprocedure Evaluation (Signed)
 Anesthesia Evaluation  Patient identified by MRN, date of birth, ID band Patient awake    Reviewed: Allergy & Precautions, H&P , NPO status , Patient's Chart, lab work & pertinent test results, reviewed documented beta blocker date and time   Airway Mallampati: II  TM Distance: >3 FB Neck ROM: full    Dental no notable dental hx. (+) Teeth Intact, Dental Advisory Given   Pulmonary neg pulmonary ROS   Pulmonary exam normal breath sounds clear to auscultation       Cardiovascular negative cardio ROS Normal cardiovascular exam Rhythm:regular Rate:Normal     Neuro/Psych negative neurological ROS  negative psych ROS   GI/Hepatic negative GI ROS, Neg liver ROS,,,  Endo/Other  negative endocrine ROS    Renal/GU negative Renal ROS  negative genitourinary   Musculoskeletal   Abdominal   Peds  Hematology negative hematology ROS (+) Blood dyscrasia, anemia   Anesthesia Other Findings   Reproductive/Obstetrics (+) Pregnancy                             Anesthesia Physical Anesthesia Plan  ASA: 3  Anesthesia Plan: Epidural   Post-op Pain Management: Minimal or no pain anticipated   Induction: Intravenous  PONV Risk Score and Plan: 2  Airway Management Planned: Natural Airway  Additional Equipment: None  Intra-op Plan:   Post-operative Plan:   Informed Consent: I have reviewed the patients History and Physical, chart, labs and discussed the procedure including the risks, benefits and alternatives for the proposed anesthesia with the patient or authorized representative who has indicated his/her understanding and acceptance.       Plan Discussed with: Anesthesiologist and CRNA  Anesthesia Plan Comments:        Anesthesia Quick Evaluation

## 2023-08-25 NOTE — Progress Notes (Signed)
 Patient ID: Regina Kidd, female   DOB: 12-29-1990, 33 y.o.   MRN: [id number] Pt doing well. Comfortable with epidural. No HA. No complaints   BP improved 117-128/48-78  GEN - NAD EFM - cat 1, 135 TOCO - ctxs q 3-5 mins  SVE - 5/60/-2  A/P: AROM performed with clear fluid noted.          Pt tolerated well         Continue on pitocin  per protocol          Anticipate svd

## 2023-08-25 NOTE — Progress Notes (Addendum)
 Regina Kidd is a 33 y.o. [id number] at [redacted]w[redacted]d   Subjective: Pt with no complaints.+Fms  Objective: BP (!) 151/78   Pulse 74   Temp 97.8 F (36.6 C) (Oral)   Resp 16   Ht 5' 5 (1.651 m)   Wt (!) 143.7 kg   BMI 52.73 kg/m  No intake/output data recorded. No intake/output data recorded.  FHT:  FHR: 135 bpm, variability: moderate,  accelerations:  Present,  decelerations:  Present early and variable decelerations  UC:   regular, every 2-3 minutes SVE:   Dilation: Lip/rim Effacement (%): 80 Station: -1, -2 Exam by:: Tel Hevia  Labs: Lab Results  Component Value Date   WBC 11.8 (H) 08/25/2023   HGB 10.5 (L) 08/25/2023   HCT 33.8 (L) 08/25/2023   MCV 82.8 08/25/2023   PLT 246 08/25/2023    Assessment / Plan: Induction of labor due to term with favorable cervix,  progressing well on pitocin   Labor: Progressing normally op pitocin  , s/p AROM  Preeclampsia:  n/a Fetal Wellbeing:  Category II Pain Control:  Epidural I/D:  n/a Anticipated MOD:  NSVD - plan to start pushing once complete - anticipate within the hour   Ted LELON Solo, DO 08/25/2023, 5:10 PM

## 2023-08-25 NOTE — H&P (Addendum)
 Regina Kidd is a 33 y.o. [phone] female presenting for  IOL at 80 2/7wks for elective reasons.  She is dated per first trimester US . THis is a short interval pregnancy. Hx svd x 2 ( proven pelvis to 8lbs 3oz).  She is GBS pos, NKDA She is morbidly obese.  Hx anemia RHD negative.  NIPT in expected range. Fetal bladder not well seen on US  3cm dil at last office visit but cervix long  OB History     Gravida  4   Para  2   Term  2   Preterm      AB  1   Living  2      SAB  1   IAB      Ectopic      Multiple  0   Live Births  2          Past Medical History:  Diagnosis Date   Allergy    Anemia    Past Surgical History:  Procedure Laterality Date   NO PAST SURGERIES     Family History: family history includes Breast cancer (age of onset: 91 - 24) in her maternal grandmother; Diabetes in her maternal grandmother and paternal grandfather; Hypertension in her father, maternal grandmother, paternal grandfather, and paternal grandmother; Thyroid  disease in her mother. Social History:  reports that she has never smoked. She has never used smokeless tobacco. She reports that she does not currently use alcohol after a past usage of about 1.0 standard drink of alcohol per week. She reports that she does not use drugs.     Maternal Diabetes: No Genetic Screening: Normal Maternal Ultrasounds/Referrals: Normal Fetal Ultrasounds or other Referrals:  None Maternal Substance Abuse:  No Significant Maternal Medications:  None Significant Maternal Lab Results:  Group B Strep positive Number of Prenatal Visits:greater than 3 verified prenatal visits Maternal Vaccinations:TDap Other Comments:  None  Review of Systems  Constitutional:  Positive for fatigue. Negative for activity change.  Eyes:  Negative for photophobia and visual disturbance.  Respiratory:  Negative for apnea and shortness of breath.   Genitourinary:  Negative for flank pain and pelvic pain.   Musculoskeletal:  Negative for back pain.  Neurological:  Negative for tremors, syncope, light-headedness and headaches.  Psychiatric/Behavioral:  The patient is not nervous/anxious.    Maternal Medical History:  Reason for admission: Elective IOL   Contractions: Frequency: irregular and rare.   Perceived severity is mild.   Fetal activity: Perceived fetal activity is normal.   Prenatal complications: no prenatal complications Prenatal Complications - Diabetes: none.     currently breastfeeding. Maternal Exam:  Uterine Assessment: Contraction strength is mild.  Contraction frequency is rare.  Abdomen: Patient reports generalized tenderness.  Estimated fetal weight is AGA.   Fetal presentation: vertex Introitus: Normal vulva. Vulva is negative for condylomata and lesion.  Normal vagina.  Vagina is negative for condylomata.  Pelvis: adequate for delivery.   Cervix: Cervix evaluated by digital exam.     Fetal Exam Fetal Monitor Review: Baseline rate: 145.  Variability: moderate (6-25 bpm).   Pattern: accelerations present and no decelerations.   Fetal State Assessment: Category I - tracings are normal.   Physical Exam Constitutional:      Appearance: Normal appearance. She is obese.   Cardiovascular:     Rate and Rhythm: Normal rate.  Pulmonary:     Effort: Pulmonary effort is normal.  Abdominal:     Tenderness: There is generalized abdominal tenderness.  Genitourinary:    General: Normal vulva.  Vulva is no lesion.   Musculoskeletal:        General: Normal range of motion.     Cervical back: Normal range of motion.   Skin:    General: Skin is warm and dry.     Capillary Refill: Capillary refill takes 2 to 3 seconds.   Neurological:     General: No focal deficit present.     Mental Status: She is alert and oriented to person, place, and time. Mental status is at baseline.   Psychiatric:        Mood and Affect: Mood normal.        Behavior: Behavior normal.         Thought Content: Thought content normal.        Judgment: Judgment normal.     Prenatal labs: ABO, Rh:   Antibody:   Rubella: Immune (11/27 0000) RPR: Nonreactive (11/27 0000)  HBsAg: Negative (11/27 0000)  HIV: Non-reactive (11/27 0000)  GBS: Positive/-- (05/29 0000)   Assessment/Plan: 65bn [id number] female at 70 2/7wks for elective IOL, favorable cervix -Admit : 3.5cm dil /50% eff/-2 - Pitocin  - low dose - GBS pos ; treat with PCN  - AROM after second dose of antibx - Anticipate svd    Regina Kidd 08/25/2023, 7:50 AM

## 2023-08-26 LAB — CBC
HCT: 28.8 % — ABNORMAL LOW (ref 36.0–46.0)
Hemoglobin: 8.9 g/dL — ABNORMAL LOW (ref 12.0–15.0)
MCH: 26.1 pg (ref 26.0–34.0)
MCHC: 30.9 g/dL (ref 30.0–36.0)
MCV: 84.5 fL (ref 80.0–100.0)
Platelets: 208 10*3/uL (ref 150–400)
RBC: 3.41 MIL/uL — ABNORMAL LOW (ref 3.87–5.11)
RDW: 17.3 % — ABNORMAL HIGH (ref 11.5–15.5)
WBC: 10.4 10*3/uL (ref 4.0–10.5)
nRBC: 0 % (ref 0.0–0.2)

## 2023-08-26 LAB — RPR: RPR Ser Ql: NONREACTIVE

## 2023-08-26 LAB — BIRTH TISSUE RECOVERY COLLECTION (PLACENTA DONATION)

## 2023-08-26 MED ORDER — IBUPROFEN 600 MG PO TABS: 600.0000 mg | ORAL_TABLET | Freq: Four times a day (QID) | ORAL | 1 refills | Status: AC

## 2023-08-26 MED ORDER — RHO D IMMUNE GLOBULIN 1500 UNIT/2ML IJ SOSY
300.0000 ug | PREFILLED_SYRINGE | Freq: Once | INTRAMUSCULAR | Status: AC
  Administered 2023-08-26: 300 ug via INTRAVENOUS
  Filled 2023-08-26: qty 2

## 2023-08-26 NOTE — Progress Notes (Signed)
 Post Partum Day 1 Subjective: up ad lib, voiding, tolerating PO, + flatus, and lochia lessening. She reports pain well controlled. She is bonding well with baby. Breastfeedign - Working on latching better. She would like discharge to home today . No HA, CP, SOB or lightheadedness  Objective: Blood pressure 129/61, pulse 92, temperature 97.9 F (36.6 C), temperature source Oral, resp. rate 18, height 5' 5 (1.651 m), weight (!) 143.7 kg, SpO2 98%, unknown if currently breastfeeding.  Physical Exam:  General: alert, cooperative, and no distress Lochia: appropriate Uterine Fundus: firm, abdomen still distended but soft  Incision: n/a DVT Evaluation: No evidence of DVT seen on physical exam.  Recent Labs    08/25/23 0921 08/26/23 0459  HGB 10.5* 8.9*  HCT 33.8* 28.8*    Assessment/Plan: Discharge home and Breastfeeding 6 week pp visit planned Received rhogam today    LOS: 1 day   Semya Klinke W Skii Cleland, DO 08/26/2023, 11:02 AM

## 2023-08-26 NOTE — Progress Notes (Signed)
 Verified rhogam with Annabella Molt, RN. Tennie Suzen BRAVO, RN

## 2023-08-26 NOTE — Discharge Summary (Signed)
 Postpartum Discharge Summary  Date of Service updated      Patient Name: Regina Kidd DOB: April 28, 1990 MRN: [id number]  Date of admission: 08/25/2023 Delivery date:08/25/2023 Delivering provider: DELANA TED MORRISON Date of discharge: 08/26/2023  Admitting diagnosis: Term pregnancy [Z34.90] Intrauterine pregnancy: [redacted]w[redacted]d     Secondary diagnosis:  Principal Problem:   Term pregnancy  Additional problems: anemia of acute blood loss with no clinical significance     Discharge diagnosis: Term Pregnancy Delivered and Anemia                                              Post partum procedures:rhogam Augmentation: AROM and Pitocin  Complications: None  Hospital course: Induction of Labor With Vaginal Delivery   33 y.o. yo [phone] at [redacted]w[redacted]d was admitted to the hospital 08/25/2023 for induction of labor.  Indication for induction: Favorable cervix at term.  Patient had an labor course complicated by n/a Membrane Rupture Time/Date: 2:31 PM,08/25/2023  Delivery Method:Vaginal, Spontaneous Operative Delivery:N/A Episiotomy: None Lacerations:  Periurethral Details of delivery can be found in separate delivery note.  Patient had a postpartum course complicated by n/a. Patient is discharged home 08/26/23.  Newborn Data: Birth date:08/25/2023 Birth time:5:33 PM Gender:Female Living status:Living Apgars:8 ,9  Weight:3560 g  Magnesium Sulfate received: No BMZ received: No Rhophylac :Yes MMR:N/A T-DaP:Given prenatally Flu: No RSV Vaccine received: No Transfusion:No Immunizations administered: Immunization History  Administered Date(s) Administered   Influenza Inj Mdck Quad Pf 11/22/2021   Influenza, Seasonal, Injecte, Preservative Fre 12/08/2022   Influenza,inj,Quad PF,6+ Mos 11/13/2017, 12/02/2020   Moderna Sars-Covid-2 Vaccination 11/03/2019   Tdap 01/08/2017, 04/10/2018    Physical exam  Vitals:   08/25/23 2154 08/26/23 0122 08/26/23 0500 08/26/23 0900  BP: 130/73 126/68  (!) 125/58 129/61  Pulse: 90 85 84 92  Resp: 18 18 18 18   Temp: 98.1 F (36.7 C) 98.5 F (36.9 C) 97.7 F (36.5 C) 97.9 F (36.6 C)  TempSrc: Oral Oral Oral Oral  SpO2:  98% 99% 98%  Weight:      Height:       General: alert, cooperative, and no distress Lochia: appropriate Uterine Fundus: firm Incision: N/A DVT Evaluation: No evidence of DVT seen on physical exam. Labs: Lab Results  Component Value Date   WBC 10.4 08/26/2023   HGB 8.9 (L) 08/26/2023   HCT 28.8 (L) 08/26/2023   MCV 84.5 08/26/2023   PLT 208 08/26/2023      Latest Ref Rng & Units 08/25/2023   12:20 PM  CMP  Glucose 70 - 99 mg/dL 891   BUN 6 - 20 mg/dL 7   Creatinine 9.55 - 8.99 mg/dL 9.30   Sodium 864 - 854 mmol/L 138   Potassium 3.5 - 5.1 mmol/L 3.9   Chloride 98 - 111 mmol/L 108   CO2 22 - 32 mmol/L 19   Calcium 8.9 - 10.3 mg/dL 8.4   Total Protein 6.5 - 8.1 g/dL 5.9   Total Bilirubin 0.0 - 1.2 mg/dL 0.6   Alkaline Phos 38 - 126 U/L 115   AST 15 - 41 U/L 15   ALT 0 - 44 U/L 13    Edinburgh Score:    05/27/2022    1:44 PM  Edinburgh Postnatal Depression Scale Screening Tool  I have been able to laugh and see the funny side of things. 0  I  have looked forward with enjoyment to things. 0  I have blamed myself unnecessarily when things went wrong. 0  I have been anxious or worried for no good reason. 1  I have felt scared or panicky for no good reason. 1  Things have been getting on top of me. 0  I have been so unhappy that I have had difficulty sleeping. 0  I have felt sad or miserable. 0  I have been so unhappy that I have been crying. 0  The thought of harming myself has occurred to me. 0  Edinburgh Postnatal Depression Scale Total 2      Data saved with a previous flowsheet row definition      After visit meds:  Allergies as of 08/26/2023   No Known Allergies      Medication List     TAKE these medications    ACCRUFER PO Take by mouth.   ibuprofen  600 MG tablet Commonly  known as: ADVIL  Take 1 tablet (600 mg total) by mouth every 6 (six) hours.   prenatal multivitamin Tabs tablet Take 1 tablet by mouth daily at 12 noon.         Discharge home in stable condition Infant Feeding: Breast Infant Disposition:home with mother Discharge instruction: per After Visit Summary and Postpartum booklet. Activity: Advance as tolerated. Pelvic rest for 6 weeks.  Diet: routine diet Anticipated Birth Control: Unsure Postpartum Appointment:6 weeks Additional Postpartum F/U: Postpartum Depression checkup Future Appointments:No future appointments. Follow up Visit:  Follow-up Information     [facility] Schedule an appointment as soon as possible for a visit in 6 week(s).   Why: For postpartum visit Contact information: [address]  [address] [place] [address] [phone]                     08/26/2023 Ted LELON Solo, DO

## 2023-08-26 NOTE — Discharge Instructions (Signed)
 Call office with any concerns [phone]

## 2023-08-27 LAB — RH IG WORKUP (INCLUDES ABO/RH)
Fetal Screen: NEGATIVE
Gestational Age(Wks): 39
Unit division: 0

## 2023-08-28 ENCOUNTER — Ambulatory Visit: Payer: Self-pay | Admitting: Family

## 2023-08-28 NOTE — Progress Notes (Signed)
 noted

## 2023-09-04 ENCOUNTER — Telehealth (HOSPITAL_COMMUNITY): Payer: Self-pay | Admitting: *Deleted

## 2023-09-04 NOTE — Telephone Encounter (Signed)
 09/04/2023  Name: Regina Kidd MRN: [id number] DOB: May 01, 1990  Reason for Call:  Transition of Care Hospital Discharge Call  Contact Status: Patient Contact Status: Complete  Language assistant needed: Interpreter Mode: Interpreter Not Needed        Follow-Up Questions: Do You Have Any Concerns About Your Health As You Heal From Delivery?: No Do You Have Any Concerns About Your Infants Health?: No  Edinburgh Postnatal Depression Scale:  In the Past 7 Days: I have been able to laugh and see the funny side of things.: As much as I always could I have looked forward with enjoyment to things.: As much as I ever did I have blamed myself unnecessarily when things went wrong.: No, never I have been anxious or worried for no good reason.: Hardly ever I have felt scared or panicky for no good reason.: No, not at all Things have been getting on top of me.: No, I have been coping as well as ever I have been so unhappy that I have had difficulty sleeping.: Not at all I have felt sad or miserable.: No, not at all I have been so unhappy that I have been crying.: No, never The thought of harming myself has occurred to me.: Never [place] Postnatal Depression Scale Total: 1  PHQ2-9 Depression Scale:     Discharge Follow-up: Edinburgh score requires follow up?: No Patient was advised of the following resources:: Support Group, Breastfeeding Support Group (declines postpartum group information via email)  Post-discharge interventions: Reviewed Newborn Safe Sleep Practices  Mliss Sieve, RN 09/04/2023 10:12

## 2023-09-06 ENCOUNTER — Inpatient Hospital Stay (HOSPITAL_COMMUNITY)

## 2023-09-22 ENCOUNTER — Emergency Department

## 2023-09-22 ENCOUNTER — Other Ambulatory Visit: Payer: Self-pay

## 2023-09-22 ENCOUNTER — Emergency Department
Admission: EM | Admit: 2023-09-22 | Discharge: 2023-09-22 | Disposition: A | Discharge status: Home / Self Care | Attending: Emergency Medicine | Admitting: Emergency Medicine

## 2023-09-22 DIAGNOSIS — R109 Unspecified abdominal pain: Secondary | ICD-10-CM | POA: Diagnosis not present

## 2023-09-22 DIAGNOSIS — R1084 Generalized abdominal pain: Secondary | ICD-10-CM | POA: Insufficient documentation

## 2023-09-22 DIAGNOSIS — N3289 Other specified disorders of bladder: Secondary | ICD-10-CM | POA: Diagnosis not present

## 2023-09-22 DIAGNOSIS — K429 Umbilical hernia without obstruction or gangrene: Secondary | ICD-10-CM | POA: Diagnosis not present

## 2023-09-22 LAB — URINALYSIS, ROUTINE W REFLEX MICROSCOPIC
Bilirubin Urine: NEGATIVE
Glucose, UA: NEGATIVE mg/dL
Ketones, ur: NEGATIVE mg/dL
Nitrite: NEGATIVE
Protein, ur: NEGATIVE mg/dL
Specific Gravity, Urine: 1.021 (ref 1.005–1.030)
pH: 5 (ref 5.0–8.0)

## 2023-09-22 LAB — CBC
HCT: 35.6 % — ABNORMAL LOW (ref 36.0–46.0)
Hemoglobin: 11 g/dL — ABNORMAL LOW (ref 12.0–15.0)
MCH: 25.8 pg — ABNORMAL LOW (ref 26.0–34.0)
MCHC: 30.9 g/dL (ref 30.0–36.0)
MCV: 83.4 fL (ref 80.0–100.0)
Platelets: 228 K/uL (ref 150–400)
RBC: 4.27 MIL/uL (ref 3.87–5.11)
RDW: 15.9 % — ABNORMAL HIGH (ref 11.5–15.5)
WBC: 9.2 K/uL (ref 4.0–10.5)
nRBC: 0 % (ref 0.0–0.2)

## 2023-09-22 LAB — COMPREHENSIVE METABOLIC PANEL WITH GFR
ALT: 34 U/L (ref 0–44)
AST: 25 U/L (ref 15–41)
Albumin: 3.2 g/dL — ABNORMAL LOW (ref 3.5–5.0)
Alkaline Phosphatase: 85 U/L (ref 38–126)
Anion gap: 7 (ref 5–15)
BUN: 14 mg/dL (ref 6–20)
CO2: 26 mmol/L (ref 22–32)
Calcium: 8.8 mg/dL — ABNORMAL LOW (ref 8.9–10.3)
Chloride: 110 mmol/L (ref 98–111)
Creatinine, Ser: 0.88 mg/dL (ref 0.44–1.00)
GFR, Estimated: >60 mL/min (ref >60)
Glucose, Bld: 123 mg/dL — ABNORMAL HIGH (ref 70–99)
Potassium: 3.5 mmol/L (ref 3.5–5.1)
Sodium: 143 mmol/L (ref 135–145)
Total Bilirubin: 0.4 mg/dL (ref 0.0–1.2)
Total Protein: 6.4 g/dL — ABNORMAL LOW (ref 6.5–8.1)

## 2023-09-22 LAB — POC URINE PREG, ED: Preg Test, Ur: NEGATIVE

## 2023-09-22 LAB — LIPASE, BLOOD: Lipase: 42 U/L (ref 11–51)

## 2023-09-22 MED ORDER — DICYCLOMINE HCL 10 MG PO CAPS: 10.0000 mg | ORAL_CAPSULE | Freq: Three times a day (TID) | ORAL | 0 refills | Status: AC

## 2023-09-22 MED ORDER — SENNOSIDES-DOCUSATE SODIUM 8.6-50 MG PO TABS
1.0000 | ORAL_TABLET | Freq: Every day | ORAL | 0 refills | Status: AC
Start: 2023-09-22 — End: 2023-10-06

## 2023-09-22 MED ORDER — DOCUSATE SODIUM 100 MG PO CAPS
100.0000 mg | ORAL_CAPSULE | Freq: Once | ORAL | Status: AC
  Administered 2023-09-22: 100 mg via ORAL
  Filled 2023-09-22: qty 1

## 2023-09-22 MED ORDER — MORPHINE SULFATE (PF) 4 MG/ML IV SOLN
4.0000 mg | Freq: Once | INTRAVENOUS | Status: AC
  Administered 2023-09-22: 4 mg via INTRAVENOUS
  Filled 2023-09-22: qty 1

## 2023-09-22 MED ORDER — ONDANSETRON HCL 4 MG/2ML IJ SOLN
4.0000 mg | Freq: Once | INTRAMUSCULAR | Status: AC
  Administered 2023-09-22: 4 mg via INTRAVENOUS
  Filled 2023-09-22: qty 2

## 2023-09-22 MED ORDER — KETOROLAC TROMETHAMINE 15 MG/ML IJ SOLN
15.0000 mg | Freq: Once | INTRAMUSCULAR | Status: AC
  Administered 2023-09-22: 15 mg via INTRAVENOUS
  Filled 2023-09-22: qty 1

## 2023-09-22 MED ORDER — DICYCLOMINE HCL 10 MG PO CAPS
10.0000 mg | ORAL_CAPSULE | Freq: Once | ORAL | Status: AC
  Administered 2023-09-22: 10 mg via ORAL
  Filled 2023-09-22: qty 1

## 2023-09-22 NOTE — Discharge Instructions (Addendum)
 No signs of infection in your abdomen or urine.  We do see evidence of constipation and I suspect likely you are having severe bowel spasms from this.  You can pick up MiraLAX over-the-counter and take 1-2 capfuls per day to help with bowel movements.  I have also sent additional medication for you to take as prescribed to help with bowel movements and constipation.   I initially sent a medication called dicyclomine  to your pharmacy.  After reviewing this regarding lactation, I would recommend actually not taking it as it can pass through the milk.  We will focus on trying to increase stool output and move your constipation.  When you first get home, please pump and throw out the initial milk as it may contain some of the medications we gave you here.  You should be safe for breast-feeding after that.

## 2023-09-22 NOTE — ED Triage Notes (Signed)
 Pt reports sharp sudden left flank pain that woke her from sleep. Reports 3-weeks postpartum vaginal delivery, denies complications.

## 2023-09-22 NOTE — ED Provider Notes (Signed)
 [facility] Provider Note    Event Date/Time   First MD Initiated Contact with Patient 09/22/23 0249     (approximate)   History   Flank Pain   HPI Regina Kidd is a 33 y.o. female presenting today for flank pain.  Patient states she was woken up overnight by sharp flank pain associated with nausea.  No vomiting.  Currently feels it worse on her right flank down into her right groin.  Otherwise denies diarrhea, constipation, dysuria, hematuria.  Delivered 3 weeks ago but no significant vaginal bleeding or other vaginal discharge.  No chest pain or shortness of breath.  No prior history of kidney stones.  No other abdominal surgeries.     Physical Exam   Triage Vital Signs: ED Triage Vitals  Encounter Vitals Group     BP 09/22/23 0242 131/79     Girls Systolic BP Percentile --      Girls Diastolic BP Percentile --      Boys Systolic BP Percentile --      Boys Diastolic BP Percentile --      Pulse Rate 09/22/23 0242 88     Resp 09/22/23 0242 18     Temp 09/22/23 0242 98.4 F (36.9 C)     Temp Source 09/22/23 0242 Oral     SpO2 09/22/23 0242 100 %     Weight --      Height --      Head Circumference --      Peak Flow --      Pain Score 09/22/23 0243 8     Pain Loc --      Pain Education --      Exclude from Growth Chart --     Most recent vital signs: Vitals:   09/22/23 0242  BP: 131/79  Pulse: 88  Resp: 18  Temp: 98.4 F (36.9 C)  SpO2: 100%   I have reviewed the vital signs. General:  Awake, alert, uncomfortable appearing and pacing in the room Head:  Normocephalic, Atraumatic. EENT:  PERRL, EOMI, Oral mucosa pink and moist, Neck is supple. Cardiovascular: Regular rate, 2+ distal pulses. Respiratory:  Normal respiratory effort, symmetrical expansion, no distress.   Abdomen: Soft, mild tenderness palpation in the right lower quadrant as well as mild right-sided CVA tenderness Extremities:  Moving all four extremities through full  ROM without pain.   Neuro:  Alert and oriented.  Interacting appropriately.   Skin:  Warm, dry, no rash.   Psych: Appropriate affect.    ED Results / Procedures / Treatments   Labs (all labs ordered are listed, but only abnormal results are displayed) Labs Reviewed  URINALYSIS, ROUTINE W REFLEX MICROSCOPIC - Abnormal; Notable for the following components:      Result Value   Color, Urine YELLOW (*)    APPearance HAZY (*)    Hgb urine dipstick SMALL (*)    Leukocytes,Ua TRACE (*)    Bacteria, UA RARE (*)    All other components within normal limits  CBC - Abnormal; Notable for the following components:   Hemoglobin 11.0 (*)    HCT 35.6 (*)    MCH 25.8 (*)    RDW 15.9 (*)    All other components within normal limits  COMPREHENSIVE METABOLIC PANEL WITH GFR - Abnormal; Notable for the following components:   Glucose, Bld 123 (*)    Calcium 8.8 (*)    Total Protein 6.4 (*)    Albumin 3.2 (*)  All other components within normal limits  LIPASE, BLOOD  POC URINE PREG, ED     EKG    RADIOLOGY Independent interpreted CT abdomen/pelvis with no acute pathology   PROCEDURES:  Critical Care performed: No  Procedures   MEDICATIONS ORDERED IN ED: Medications  dicyclomine  (BENTYL ) capsule 10 mg (has no administration in time range)  docusate sodium  (COLACE) capsule 100 mg (has no administration in time range)  ketorolac  (TORADOL ) 15 MG/ML injection 15 mg (15 mg Intravenous Given 09/22/23 0313)  ondansetron  (ZOFRAN ) injection 4 mg (4 mg Intravenous Given 09/22/23 0313)  morphine  (PF) 4 MG/ML injection 4 mg (4 mg Intravenous Given 09/22/23 0344)     IMPRESSION / MDM / ASSESSMENT AND PLAN / ED COURSE  I reviewed the triage vital signs and the nursing notes.                              Differential diagnosis includes, but is not limited to, nephrolithiasis, acute cystitis, pyelonephritis, appendicitis, cholecystitis  Patient's presentation is most consistent with acute  complicated illness / injury requiring diagnostic workup.  Patient is a 33 year old female presenting today for acute onset of flank pain associated with nausea.  Vital signs are stable.  Will give Toradol  and Zofran  for symptomatic management initially along with laboratory workup and UA.  Will also get CT abdomen/pelvis to evaluate for kidney stones versus pyelonephritis versus other acute intra-abdominal infections.  CBC, CMP, lipase all unremarkable.  UA with no signs of infection.  CT abdomen/pelvis shows no evidence of kidney stone or other acute intra-abdominal infections.  There is notable constipation so I suspect likely constipation with bowel spasms contributing to her symptoms at this time.  Reassessed and feeling back to baseline at this time.  Will discharge on bowel regimen with PCP follow-up and strict return precautions.  Clinical Course as of 09/22/23 0431  Sat Sep 22, 2023  0358 Urinalysis, Routine w reflex microscopic -Urine, Clean Catch(!) No UTI [DW]  0423 Reassessed and feeling better.  Will discharge at this time [DW]    Clinical Course User Index [DW] Malvina Alm DASEN, MD     FINAL CLINICAL IMPRESSION(S) / ED DIAGNOSES   Final diagnoses:  Generalized abdominal pain     Rx / DC Orders   ED Discharge Orders          Ordered    senna-docusate (SENOKOT-S) 8.6-50 MG tablet  Daily        09/22/23 0425    dicyclomine  (BENTYL ) 10 MG capsule  3 times daily before meals        09/22/23 0425             Note:  This document was prepared using Dragon voice recognition software and may include unintentional dictation errors.   Malvina Alm DASEN, MD 09/22/23 [phone]

## 2023-10-10 DIAGNOSIS — Z1332 Encounter for screening for maternal depression: Secondary | ICD-10-CM | POA: Diagnosis not present

## 2023-10-10 DIAGNOSIS — Z3009 Encounter for other general counseling and advice on contraception: Secondary | ICD-10-CM | POA: Diagnosis not present

## 2023-11-01 ENCOUNTER — Telehealth: Payer: Self-pay | Admitting: Family

## 2023-11-01 NOTE — Telephone Encounter (Signed)
 Lvm that we have added patient to waitlist, and currently we don't have anything sooner to move her Physical.    Copied from CRM #[id number]. Topic: Appointments - Scheduling Inquiry for Clinic >> Nov 01, 2023  1:50 PM Mia F wrote: Reason for CRM: Pt is scheduled for 12/31 for a physical but says she need to be seen by 01/04/24 for insurance purposes. She is on the wait list but if there is any way she can be seen sooner please contact pt

## 2023-11-06 DIAGNOSIS — R5383 Other fatigue: Secondary | ICD-10-CM | POA: Diagnosis not present

## 2023-11-06 DIAGNOSIS — Z0000 Encounter for general adult medical examination without abnormal findings: Secondary | ICD-10-CM | POA: Diagnosis not present

## 2023-11-06 DIAGNOSIS — R635 Abnormal weight gain: Secondary | ICD-10-CM | POA: Diagnosis not present

## 2023-11-23 ENCOUNTER — Encounter: Payer: Self-pay | Admitting: Family

## 2023-11-23 NOTE — Telephone Encounter (Signed)
 Appointment canceled for Regina Kidd ([id number])  Visit type: PHYSICAL  03/05/2024 8:00 AM (20 minutes) with Ginger Patrick in [facility]    Reason for cancellation: Unhappy/Changed Provider    Patient comments: I had to change my provider to one where I could get scheduled in October for work purposes. Their office is also closer to my work, so it's a little more convenient for me. Thanks!

## 2023-11-23 NOTE — Telephone Encounter (Signed)
 error

## 2023-12-04 DIAGNOSIS — R635 Abnormal weight gain: Secondary | ICD-10-CM | POA: Diagnosis not present

## 2023-12-04 DIAGNOSIS — R5383 Other fatigue: Secondary | ICD-10-CM | POA: Diagnosis not present

## 2023-12-04 DIAGNOSIS — Z0000 Encounter for general adult medical examination without abnormal findings: Secondary | ICD-10-CM | POA: Diagnosis not present

## 2023-12-11 DIAGNOSIS — E782 Mixed hyperlipidemia: Secondary | ICD-10-CM | POA: Diagnosis not present

## 2023-12-11 DIAGNOSIS — Z23 Encounter for immunization: Secondary | ICD-10-CM | POA: Diagnosis not present

## 2023-12-11 DIAGNOSIS — R635 Abnormal weight gain: Secondary | ICD-10-CM | POA: Diagnosis not present

## 2023-12-11 DIAGNOSIS — Z0000 Encounter for general adult medical examination without abnormal findings: Secondary | ICD-10-CM | POA: Diagnosis not present

## 2023-12-18 ENCOUNTER — Encounter: Payer: Self-pay | Admitting: *Deleted

## 2023-12-18 NOTE — Telephone Encounter (Signed)
 This encounter was created in error - please disregard.

## 2024-03-05 ENCOUNTER — Encounter: Admitting: Family
